# Patient Record
Sex: Female | Born: 1995 | Race: Black or African American | Hispanic: No | Marital: Single | Smoking: Current some day smoker
Health system: Southern US, Community
[De-identification: ages and names within clinical notes are randomized; demographics above are authoritative.]

## PROBLEM LIST (undated history)

## (undated) ENCOUNTER — Ambulatory Visit (HOSPITAL_COMMUNITY): Admission: EM | Payer: Self-pay | Source: Home / Self Care

## (undated) DIAGNOSIS — E1169 Type 2 diabetes mellitus with other specified complication: Secondary | ICD-10-CM

## (undated) DIAGNOSIS — M5430 Sciatica, unspecified side: Secondary | ICD-10-CM

## (undated) DIAGNOSIS — E669 Obesity, unspecified: Secondary | ICD-10-CM

## (undated) HISTORY — PX: NO PAST SURGERIES: SHX2092

---
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
## 2014-12-31 ENCOUNTER — Encounter (HOSPITAL_COMMUNITY): Payer: Self-pay | Admitting: Emergency Medicine

## 2014-12-31 ENCOUNTER — Emergency Department (HOSPITAL_COMMUNITY)
Admission: EM | Admit: 2014-12-31 | Discharge: 2014-12-31 | Disposition: A | Discharge status: Home / Self Care | Payer: Medicaid - Out of State | Attending: Emergency Medicine | Admitting: Emergency Medicine

## 2014-12-31 DIAGNOSIS — M5431 Sciatica, right side: Secondary | ICD-10-CM | POA: Insufficient documentation

## 2014-12-31 MED ORDER — CYCLOBENZAPRINE HCL 10 MG PO TABS
10.0000 mg | ORAL_TABLET | Freq: Once | ORAL | Status: AC
  Administered 2014-12-31: 10 mg via ORAL
  Filled 2014-12-31: qty 1

## 2014-12-31 MED ORDER — MELOXICAM 15 MG PO TABS: 15.0000 mg | ORAL_TABLET | Freq: Every day | ORAL | Status: DC

## 2014-12-31 MED ORDER — CYCLOBENZAPRINE HCL 10 MG PO TABS: 10.0000 mg | ORAL_TABLET | Freq: Two times a day (BID) | ORAL | Status: DC | PRN

## 2014-12-31 MED ORDER — KETOROLAC TROMETHAMINE 60 MG/2ML IM SOLN
60.0000 mg | Freq: Once | INTRAMUSCULAR | Status: AC
  Administered 2014-12-31: 60 mg via INTRAMUSCULAR
  Filled 2014-12-31: qty 2

## 2014-12-31 MED ORDER — PREDNISONE 20 MG PO TABS: 40.0000 mg | ORAL_TABLET | Freq: Every day | ORAL | Status: DC

## 2014-12-31 MED ORDER — PREDNISONE 20 MG PO TABS
40.0000 mg | ORAL_TABLET | Freq: Once | ORAL | Status: AC
  Administered 2014-12-31: 40 mg via ORAL
  Filled 2014-12-31: qty 2

## 2014-12-31 NOTE — ED Provider Notes (Signed)
CSN: [id number]     Arrival date & time 12/31/14  0146 History   First MD Initiated Contact with Patient 12/31/14 0224     Chief Complaint  Patient presents with  . Back Pain     (Consider location/radiation/quality/duration/timing/severity/associated sxs/prior Treatment) Patient is a 19 y.o. female presenting with back pain. The history is provided by the patient. No language interpreter was used.  Back Pain Location:  Lumbar spine Quality:  Shooting and aching Radiates to:  R posterior upper leg and R foot Pain severity:  Severe Pain is:  Same all the time Onset quality:  Gradual Duration:  2 weeks Timing:  Constant Progression:  Unchanged Chronicity:  New Context: not emotional stress, not falling, not jumping from heights, not lifting heavy objects, not MCA, not MVA, not occupational injury, not pedestrian accident, not physical stress, not recent illness, not recent injury and not twisting   Relieved by:  Nothing Worsened by:  Movement Ineffective treatments:  Ibuprofen Associated symptoms: no abdominal pain, no abdominal swelling, no bladder incontinence, no bowel incontinence, no fever, no headaches, no leg pain, no numbness, no pelvic pain, no tingling, no weakness and no weight loss   Risk factors: no hx of cancer, no hx of osteoporosis, no lack of exercise, no menopause, not obese, not pregnant, no recent surgery, no steroid use and no vascular disease     History reviewed. No pertinent past medical history. History reviewed. No pertinent past surgical history. History reviewed. No pertinent family history. Social History  Substance Use Topics  . Smoking status: Never Smoker   . Smokeless tobacco: None  . Alcohol Use: No   OB History    No data available     Review of Systems  Constitutional: Negative for fever and weight loss.  Gastrointestinal: Negative for abdominal pain and bowel incontinence.  Genitourinary: Negative for bladder incontinence and pelvic  pain.  Musculoskeletal: Positive for back pain.  Neurological: Negative for tingling, weakness, numbness and headaches.  All other systems reviewed and are negative.     Allergies  Review of patient's allergies indicates no known allergies.  Home Medications   Prior to Admission medications   Medication Sig Start Date End Date Taking? Authorizing Provider  ibuprofen (ADVIL,MOTRIN) 200 MG tablet Take 200 mg by mouth every 6 (six) hours as needed for moderate pain.    Yes Historical Provider, MD  benzonatate (TESSALON) 100 MG capsule Take 1 capsule (100 mg total) by mouth 3 (three) times daily as needed for cough. Patient not taking: Reported on 12/31/2014 04/10/13   Mathis Fare Presson, PA  ipratropium (ATROVENT) 0.06 % nasal spray Place 2 sprays into both nostrils 4 (four) times daily. Patient not taking: Reported on 12/31/2014 04/10/13   Jess Barters H Presson, PA   BP 120/78 mmHg  Pulse 75  Temp(Src) 98.4 F (36.9 C) (Oral)  Resp 18  SpO2 98%  LMP 12/14/2014 (Approximate) Physical Exam  Constitutional: She is oriented to person, place, and time. She appears well-developed and well-nourished. No distress.  HENT:  Head: Normocephalic and atraumatic.  Eyes: Conjunctivae and EOM are normal.  Neck: Normal range of motion.  Cardiovascular: Normal rate and regular rhythm.  Exam reveals no gallop and no friction rub.   No murmur heard. Pulmonary/Chest: Effort normal and breath sounds normal. She has no wheezes. She has no rales. She exhibits no tenderness.  Abdominal: Soft. She exhibits no distension. There is no tenderness. There is no rebound.  Musculoskeletal: Normal range of  motion.  Neurological: She is alert and oriented to person, place, and time. Coordination normal.  Speech is goal-oriented. Moves limbs without ataxia.   Skin: Skin is warm and dry.  Psychiatric: She has a normal mood and affect. Her behavior is normal.  Nursing note and vitals reviewed.   ED Course   Procedures (including critical care time) Labs Review Labs Reviewed - No data to display  Imaging Review No results found. I have personally reviewed and evaluated these images and lab results as part of my medical decision-making.   EKG Interpretation None      MDM   Final diagnoses:  Sciatica of right side    3:03 AM Patient clinically has sciatica on the right. Patient has no neuro deficits. Vitals stable and patient afebrile. Patient denies any injury. No xray needed at this time. Patient will have IM toradol, prednisone, and flexeril. Patient will be discharged with course of prednisone, mobic, and flexeril.     Emilia BeckKaitlyn Burleigh Brockmann, PA-C 12/31/14 [id number]  Loren Raceravid [place], MD 01/05/15 [phone]

## 2014-12-31 NOTE — ED Notes (Signed)
Pt states that she started having back pain approx. 2 weeks go that is now radiating down her R leg. Alert and oriented.

## 2014-12-31 NOTE — Discharge Instructions (Signed)
Take prednisone as directed until gone. Take mobic as needed for pain. Take flexeril as needed for muscle spasm. Refer to attached documents for more information.

## 2015-03-29 ENCOUNTER — Other Ambulatory Visit (INDEPENDENT_AMBULATORY_CARE_PROVIDER_SITE_OTHER): Payer: BLUE CROSS/BLUE SHIELD

## 2015-03-29 ENCOUNTER — Encounter: Payer: Self-pay | Admitting: Internal Medicine

## 2015-03-29 ENCOUNTER — Ambulatory Visit (INDEPENDENT_AMBULATORY_CARE_PROVIDER_SITE_OTHER): Payer: BLUE CROSS/BLUE SHIELD | Admitting: Internal Medicine

## 2015-03-29 VITALS — BP 104/72 | HR 70 | Temp 98.1°F | Resp 12 | Ht 65.0 in | Wt 247.0 lb

## 2015-03-29 DIAGNOSIS — Z0000 Encounter for general adult medical examination without abnormal findings: Secondary | ICD-10-CM | POA: Diagnosis not present

## 2015-03-29 LAB — LIPID PANEL
Cholesterol: 169 mg/dL (ref 0–200)
HDL: 53.5 mg/dL (ref >39.00)
LDL Cholesterol: 101 mg/dL — ABNORMAL HIGH (ref 0–99)
NONHDL: 115.96
Total CHOL/HDL Ratio: 3
Triglycerides: 77 mg/dL (ref 0.0–149.0)
VLDL: 15.4 mg/dL (ref 0.0–40.0)

## 2015-03-29 LAB — COMPREHENSIVE METABOLIC PANEL
ALT: 13 U/L (ref 0–35)
AST: 12 U/L (ref 0–37)
Albumin: 3.7 g/dL (ref 3.5–5.2)
Alkaline Phosphatase: 55 U/L (ref 39–117)
BUN: 10 mg/dL (ref 6–23)
CO2: 28 mEq/L (ref 19–32)
CREATININE: 0.76 mg/dL (ref 0.40–1.20)
Calcium: 9.1 mg/dL (ref 8.4–10.5)
Chloride: 106 mEq/L (ref 96–112)
GFR: 124.18 mL/min (ref >60.00)
Glucose, Bld: 147 mg/dL — ABNORMAL HIGH (ref 70–99)
Potassium: 4.3 mEq/L (ref 3.5–5.1)
SODIUM: 140 meq/L (ref 135–145)
TOTAL PROTEIN: 7.3 g/dL (ref 6.0–8.3)
Total Bilirubin: 0.2 mg/dL (ref 0.2–1.2)

## 2015-03-29 LAB — CBC
HCT: 36.3 % (ref 36.0–46.0)
HEMOGLOBIN: 11.8 g/dL — AB (ref 12.0–15.0)
MCHC: 32.5 g/dL (ref 30.0–36.0)
MCV: 83.9 fl (ref 78.0–100.0)
Platelets: 276 10*3/uL (ref 150.0–400.0)
RBC: 4.33 Mil/uL (ref 3.87–5.11)
RDW: 16.2 % — ABNORMAL HIGH (ref 11.5–14.6)
WBC: 6.8 10*3/uL (ref 4.5–10.5)

## 2015-03-29 LAB — HEMOGLOBIN A1C: Hgb A1c MFr Bld: 6.5 % (ref 4.6–6.5)

## 2015-03-29 NOTE — Patient Instructions (Signed)
We will check the labs today and check the urine for signs of infection. If there are no signs of infection we will call in diflucan for a yeast infection that is a pill you take once. If there are signs of infection we will call in an antibiotic.   Keep working on time for exercising.   Health Maintenance, Female Adopting a healthy lifestyle and getting preventive care can go a long way to promote health and wellness. Talk with your health care provider about what schedule of regular examinations is right for you. This is a good chance for you to check in with your provider about disease prevention and staying healthy. In between checkups, there are plenty of things you can do on your own. Experts have done a lot of research about which lifestyle changes and preventive measures are most likely to keep you healthy. Ask your health care provider for more information. WEIGHT AND DIET  Eat a healthy diet  Be sure to include plenty of vegetables, fruits, low-fat dairy products, and lean protein.  Do not eat a lot of foods high in solid fats, added sugars, or salt.  Get regular exercise. This is one of the most important things you can do for your health.  Most adults should exercise for at least 150 minutes each week. The exercise should increase your heart rate and make you sweat (moderate-intensity exercise).  Most adults should also do strengthening exercises at least twice a week. This is in addition to the moderate-intensity exercise.  Maintain a healthy weight  Body mass index (BMI) is a measurement that can be used to identify possible weight problems. It estimates body fat based on height and weight. Your health care provider can help determine your BMI and help you achieve or maintain a healthy weight.  For females 6 years of age and older:   A BMI below 18.5 is considered underweight.  A BMI of 18.5 to 24.9 is normal.  A BMI of 25 to 29.9 is considered overweight.  A BMI of 30  and above is considered obese.  Watch levels of cholesterol and blood lipids  You should start having your blood tested for lipids and cholesterol at 21 years of age, then have this test every 5 years.  You may need to have your cholesterol levels checked more often if:  Your lipid or cholesterol levels are high.  You are older than 21 years of age.  You are at high risk for heart disease.  CANCER SCREENING   Lung Cancer  Lung cancer screening is recommended for adults 70-18 years old who are at high risk for lung cancer because of a history of smoking.  A yearly low-dose CT scan of the lungs is recommended for people who:  Currently smoke.  Have quit within the past 15 years.  Have at least a 30-pack-year history of smoking. A pack year is smoking an average of one pack of cigarettes a day for 1 year.  Yearly screening should continue until it has been 15 years since you quit.  Yearly screening should stop if you develop a health problem that would prevent you from having lung cancer treatment.  Breast Cancer  Practice breast self-awareness. This means understanding how your breasts normally appear and feel.  It also means doing regular breast self-exams. Let your health care provider know about any changes, no matter how small.  If you are in your 20s or 30s, you should have a clinical breast exam (  CBE) by a health care provider every 1-3 years as part of a regular health exam.  If you are 61 or older, have a CBE every year. Also consider having a breast X-ray (mammogram) every year.  If you have a family history of breast cancer, talk to your health care provider about genetic screening.  If you are at high risk for breast cancer, talk to your health care provider about having an MRI and a mammogram every year.  Breast cancer gene (BRCA) assessment is recommended for women who have family members with BRCA-related cancers. BRCA-related cancers  include:  Breast.  Ovarian.  Tubal.  Peritoneal cancers.  Results of the assessment will determine the need for genetic counseling and BRCA1 and BRCA2 testing. Cervical Cancer Your health care provider may recommend that you be screened regularly for cancer of the pelvic organs (ovaries, uterus, and vagina). This screening involves a pelvic examination, including checking for microscopic changes to the surface of your cervix (Pap test). You may be encouraged to have this screening done every 3 years, beginning at age 86.  For women ages 65-65, health care providers may recommend pelvic exams and Pap testing every 3 years, or they may recommend the Pap and pelvic exam, combined with testing for human papilloma virus (HPV), every 5 years. Some types of HPV increase your risk of cervical cancer. Testing for HPV may also be done on women of any age with unclear Pap test results.  Other health care providers may not recommend any screening for nonpregnant women who are considered low risk for pelvic cancer and who do not have symptoms. Ask your health care provider if a screening pelvic exam is right for you.  If you have had past treatment for cervical cancer or a condition that could lead to cancer, you need Pap tests and screening for cancer for at least 20 years after your treatment. If Pap tests have been discontinued, your risk factors (such as having a new sexual partner) need to be reassessed to determine if screening should resume. Some women have medical problems that increase the chance of getting cervical cancer. In these cases, your health care provider may recommend more frequent screening and Pap tests. Colorectal Cancer  This type of cancer can be detected and often prevented.  Routine colorectal cancer screening usually begins at 21 years of age and continues through 21 years of age.  Your health care provider may recommend screening at an earlier age if you have risk factors for  colon cancer.  Your health care provider may also recommend using home test kits to check for hidden blood in the stool.  A small camera at the end of a tube can be used to examine your colon directly (sigmoidoscopy or colonoscopy). This is done to check for the earliest forms of colorectal cancer.  Routine screening usually begins at age 29.  Direct examination of the colon should be repeated every 5-10 years through 21 years of age. However, you may need to be screened more often if early forms of precancerous polyps or small growths are found. Skin Cancer  Check your skin from head to toe regularly.  Tell your health care provider about any new moles or changes in moles, especially if there is a change in a mole's shape or color.  Also tell your health care provider if you have a mole that is larger than the size of a pencil eraser.  Always use sunscreen. Apply sunscreen liberally and repeatedly throughout  the day.  Protect yourself by wearing long sleeves, pants, a wide-brimmed hat, and sunglasses whenever you are outside. HEART DISEASE, DIABETES, AND HIGH BLOOD PRESSURE   High blood pressure causes heart disease and increases the risk of stroke. High blood pressure is more likely to develop in:  People who have blood pressure in the high end of the normal range (130-139/85-89 mm Hg).  People who are overweight or obese.  People who are African American.  If you are 18-39 years of age, have your blood pressure checked every 3-5 years. If you are 40 years of age or older, have your blood pressure checked every year. You should have your blood pressure measured twice--once when you are at a hospital or clinic, and once when you are not at a hospital or clinic. Record the average of the two measurements. To check your blood pressure when you are not at a hospital or clinic, you can use:  An automated blood pressure machine at a pharmacy.  A home blood pressure monitor.  If you  are between 55 years and 79 years old, ask your health care provider if you should take aspirin to prevent strokes.  Have regular diabetes screenings. This involves taking a blood sample to check your fasting blood sugar level.  If you are at a normal weight and have a low risk for diabetes, have this test once every three years after 21 years of age.  If you are overweight and have a high risk for diabetes, consider being tested at a younger age or more often. PREVENTING INFECTION  Hepatitis B  If you have a higher risk for hepatitis B, you should be screened for this virus. You are considered at high risk for hepatitis B if:  You were born in a country where hepatitis B is common. Ask your health care provider which countries are considered high risk.  Your parents were born in a high-risk country, and you have not been immunized against hepatitis B (hepatitis B vaccine).  You have HIV or AIDS.  You use needles to inject street drugs.  You live with someone who has hepatitis B.  You have had sex with someone who has hepatitis B.  You get hemodialysis treatment.  You take certain medicines for conditions, including cancer, organ transplantation, and autoimmune conditions. Hepatitis C  Blood testing is recommended for:  Everyone born from 1945 through 1965.  Anyone with known risk factors for hepatitis C. Sexually transmitted infections (STIs)  You should be screened for sexually transmitted infections (STIs) including gonorrhea and chlamydia if:  You are sexually active and are younger than 21 years of age.  You are older than 21 years of age and your health care provider tells you that you are at risk for this type of infection.  Your sexual activity has changed since you were last screened and you are at an increased risk for chlamydia or gonorrhea. Ask your health care provider if you are at risk.  If you do not have HIV, but are at risk, it may be recommended that you  take a prescription medicine daily to prevent HIV infection. This is called pre-exposure prophylaxis (PrEP). You are considered at risk if:  You are sexually active and do not regularly use condoms or know the HIV status of your partner(s).  You take drugs by injection.  You are sexually active with a partner who has HIV. Talk with your health care provider about whether you are at high risk   of being infected with HIV. If you choose to begin PrEP, you should first be tested for HIV. You should then be tested every 3 months for as long as you are taking PrEP.  PREGNANCY   If you are premenopausal and you may become pregnant, ask your health care provider about preconception counseling.  If you may become pregnant, take 400 to 800 micrograms (mcg) of folic acid every day.  If you want to prevent pregnancy, talk to your health care provider about birth control (contraception). OSTEOPOROSIS AND MENOPAUSE   Osteoporosis is a disease in which the bones lose minerals and strength with aging. This can result in serious bone fractures. Your risk for osteoporosis can be identified using a bone density scan.  If you are 65 years of age or older, or if you are at risk for osteoporosis and fractures, ask your health care provider if you should be screened.  Ask your health care provider whether you should take a calcium or vitamin D supplement to lower your risk for osteoporosis.  Menopause may have certain physical symptoms and risks.  Hormone replacement therapy may reduce some of these symptoms and risks. Talk to your health care provider about whether hormone replacement therapy is right for you.  HOME CARE INSTRUCTIONS   Schedule regular health, dental, and eye exams.  Stay current with your immunizations.   Do not use any tobacco products including cigarettes, chewing tobacco, or electronic cigarettes.  If you are pregnant, do not drink alcohol.  If you are breastfeeding, limit how  much and how often you drink alcohol.  Limit alcohol intake to no more than 1 drink per day for nonpregnant women. One drink equals 12 ounces of beer, 5 ounces of wine, or 1 ounces of hard liquor.  Do not use street drugs.  Do not share needles.  Ask your health care provider for help if you need support or information about quitting drugs.  Tell your health care provider if you often feel depressed.  Tell your health care provider if you have ever been abused or do not feel safe at home.   This information is not intended to replace advice given to you by your health care provider. Make sure you discuss any questions you have with your health care provider.   Document Released: 08/15/2010 Document Revised: 02/20/2014 Document Reviewed: 01/01/2013 Elsevier Interactive Patient Education 2016 [employer]  

## 2015-03-29 NOTE — Progress Notes (Signed)
Pre visit review using our clinic review tool, if applicable. No additional management support is needed unless otherwise documented below in the visit note. 

## 2015-03-30 ENCOUNTER — Encounter: Payer: Self-pay | Admitting: Internal Medicine

## 2015-03-30 DIAGNOSIS — Z0000 Encounter for general adult medical examination without abnormal findings: Secondary | ICD-10-CM | POA: Insufficient documentation

## 2015-03-30 NOTE — Assessment & Plan Note (Signed)
Checking labs today, talked to her about the need for regular exercise to help with her health. She is a non-smoker and no problems with depression or anxiety.

## 2015-03-30 NOTE — Progress Notes (Signed)
   Subjective:    Patient ID: Jade Mullins, female    DOB: 05-11-1995, 19 y.o.   MRN: [id number]  HPI The patient is a new 20 YO female coming in for wellness. No concerns or complaints. No PMH, no meds. Normal length periods with 1 day cramping. No depression or anxiety, non-smoker. Not exercising but working 2 jobs.   PMH, [facility], social history reviewed and updated.   Review of Systems  Constitutional: Negative for fever, activity change, appetite change, fatigue and unexpected weight change.  HENT: Negative.   Eyes: Negative.   Respiratory: Negative for cough, chest tightness, shortness of breath and wheezing.   Cardiovascular: Negative for chest pain, palpitations and leg swelling.  Gastrointestinal: Negative for nausea, abdominal pain, diarrhea, constipation and abdominal distention.  Musculoskeletal: Negative.   Skin: Negative.   Neurological: Negative.   Psychiatric/Behavioral: Negative.       Objective:   Physical Exam  Constitutional: She is oriented to person, place, and time. She appears well-developed and well-nourished.  Overweight  HENT:  Head: Normocephalic and atraumatic.  Eyes: EOM are normal.  Neck: Normal range of motion.  Cardiovascular: Normal rate and regular rhythm.   Pulmonary/Chest: Effort normal and breath sounds normal. No respiratory distress. She has no wheezes. She has no rales.  Abdominal: Soft. Bowel sounds are normal. She exhibits no distension. There is no tenderness. There is no rebound.  Musculoskeletal: She exhibits no edema.  Neurological: She is alert and oriented to person, place, and time. Coordination normal.  Skin: Skin is warm and dry.  Psychiatric: She has a normal mood and affect.   Filed Vitals:   03/29/15 1513  BP: 104/72  Pulse: 70  Temp: 98.1 F (36.7 C)  TempSrc: Oral  Resp: 12  Height:  (1.651 m)  Weight: 247 lb (112.038 kg)  SpO2: 98%      Assessment & Plan:

## 2015-03-30 NOTE — Assessment & Plan Note (Signed)
Talked to her about the need for exercise and healthy eating with the goal of health and not just weight loss. We did talk about the fact that extra weight does cause joints to wear more quickly and can cause long term problems.

## 2015-06-22 ENCOUNTER — Ambulatory Visit (HOSPITAL_COMMUNITY)
Admission: EM | Admit: 2015-06-22 | Discharge: 2015-06-22 | Disposition: A | Discharge status: Home / Self Care | Payer: BLUE CROSS/BLUE SHIELD | Attending: Family Medicine | Admitting: Family Medicine

## 2015-06-22 ENCOUNTER — Encounter (HOSPITAL_COMMUNITY): Payer: Self-pay | Admitting: Emergency Medicine

## 2015-06-22 DIAGNOSIS — J029 Acute pharyngitis, unspecified: Secondary | ICD-10-CM | POA: Insufficient documentation

## 2015-06-22 DIAGNOSIS — R358 Other polyuria: Secondary | ICD-10-CM | POA: Insufficient documentation

## 2015-06-22 DIAGNOSIS — M545 Low back pain, unspecified: Secondary | ICD-10-CM

## 2015-06-22 DIAGNOSIS — R6883 Chills (without fever): Secondary | ICD-10-CM | POA: Diagnosis not present

## 2015-06-22 DIAGNOSIS — Z87891 Personal history of nicotine dependence: Secondary | ICD-10-CM | POA: Diagnosis not present

## 2015-06-22 DIAGNOSIS — J069 Acute upper respiratory infection, unspecified: Secondary | ICD-10-CM

## 2015-06-22 DIAGNOSIS — R3589 Other polyuria: Secondary | ICD-10-CM

## 2015-06-22 LAB — POCT URINALYSIS DIP (DEVICE)
Bilirubin Urine: NEGATIVE
Glucose, UA: 500 mg/dL — AB
KETONES UR: NEGATIVE mg/dL
Leukocytes, UA: NEGATIVE
Nitrite: NEGATIVE
PH: 7 (ref 5.0–8.0)
PROTEIN: 100 mg/dL — AB
Specific Gravity, Urine: 1.02 (ref 1.005–1.030)
Urobilinogen, UA: 4 mg/dL — ABNORMAL HIGH (ref 0.0–1.0)

## 2015-06-22 LAB — POCT RAPID STREP A: Streptococcus, Group A Screen (Direct): NEGATIVE

## 2015-06-22 LAB — POCT PREGNANCY, URINE: Preg Test, Ur: NEGATIVE

## 2015-06-22 MED ORDER — GUAIFENESIN ER 600 MG PO TB12: 1200.0000 mg | ORAL_TABLET | Freq: Two times a day (BID) | ORAL | Status: DC | PRN

## 2015-06-22 NOTE — ED Notes (Addendum)
Sore throat and back pain.  Onset of symptoms last week.  Patient reports low back pain, chest soreness, and blood tinged sputum for 2 days.

## 2015-06-22 NOTE — ED Provider Notes (Signed)
CSN: [id number]     Arrival date & time 06/22/15  1710 History   First MD Initiated Contact with Patient 06/22/15 1802     Chief Complaint  Patient presents with  . Sore Throat  . Back Pain   (Consider location/radiation/quality/duration/timing/severity/associated sxs/prior Treatment) Patient is a 20 y.o. female presenting with pharyngitis and back pain. The history is provided by the patient. No language interpreter was used.  Sore Throat Pertinent negatives include no chest pain and no abdominal pain.  Back Pain Associated symptoms: no abdominal pain and no chest pain   Patient presents with complaint of 2 days of cough and back pain, throat swelling.  Has had headache, then back ache, with chills. No objective fevers. Developed cough with some blood in sputum. She has had some polyuria but no dysuria.  She started her LMP 2 days ago.  Roommate and best friend with similar sxs 2 days prior, just resolved spontaneously.   Social Hx; smokes 2 cigarettes every 2 days. Sick roommate.     History reviewed. No pertinent past medical history. History reviewed. No pertinent past surgical history. Family History  Problem Relation Age of Onset  . Arthritis Mother   . Diabetes Mother   . Diabetes Brother   . Hypertension Maternal Aunt   . Diabetes Maternal Aunt   . Cancer Maternal Aunt     prostate  . Hypertension Maternal Uncle   . Diabetes Maternal Uncle   . Arthritis Maternal Grandmother   . Heart disease Maternal Grandmother   . Hypertension Maternal Grandmother   . Diabetes Maternal Grandmother    Social History  Substance Use Topics  . Smoking status: Former Games developer  . Smokeless tobacco: None  . Alcohol Use: No   OB History    No data available     Review of Systems  Constitutional: Positive for chills and fatigue.  HENT: Positive for sore throat and trouble swallowing.   Respiratory: Positive for cough and wheezing.   Cardiovascular: Negative for chest pain.   Gastrointestinal: Positive for diarrhea. Negative for nausea, vomiting and abdominal pain.  Musculoskeletal: Positive for back pain.    Allergies  Review of patient's allergies indicates no known allergies.  Home Medications   Prior to Admission medications   Not on File   Meds Ordered and Administered this Visit  Medications - No data to display  BP 129/94 mmHg  Pulse 87  Temp(Src) 98.1 F (36.7 C) (Oral)  Resp 16  SpO2 98%  LMP 06/22/2015 No data found.   Physical Exam  Constitutional: She appears well-developed and well-nourished. No distress.  HENT:  Head: Normocephalic and atraumatic.  Right Ear: External ear normal.  Left Ear: External ear normal.  Oropharynx with exudate and erythema.   Eyes: Conjunctivae and EOM are normal. Right eye exhibits no discharge. Left eye exhibits no discharge. No scleral icterus.  Neck: Normal range of motion. Neck supple.  Shotty anterior cervical adenopathy  Cardiovascular: Normal rate, regular rhythm and normal heart sounds.   No murmur heard. Pulmonary/Chest: Effort normal and breath sounds normal. No respiratory distress. She has no wheezes. She has no rales. She exhibits no tenderness.  Abdominal: Soft. There is no tenderness.  Skin: She is not diaphoretic.    ED Course  Procedures (including critical care time)  Labs Review Labs Reviewed  POCT URINALYSIS DIP (DEVICE) - Abnormal; Notable for the following:    Glucose, UA 500 (*)    Hgb urine dipstick LARGE (*)  Protein, ur 100 (*)    Urobilinogen, UA 4.0 (*)    All other components within normal limits  POCT PREGNANCY, URINE    Imaging Review No results found.   Visual Acuity Review  Right Eye Distance:   Left Eye Distance:   Bilateral Distance:    Right Eye Near:   Left Eye Near:    Bilateral Near:         MDM   1. Pharyngitis   2. Chills   3. Polyuria   4. Acute low back pain    Patient with cough, pharyngitis, rapid strep negative. UA  negative for infection (patient on menses now). UPreg negative. Suspect viral URI; given roommate's recent similar episode. Supportive treatment, [facility].     Barbaraann BarthelJames O Tiauna Whisnant, MD 06/22/15 [phone]

## 2015-06-22 NOTE — Discharge Instructions (Signed)
It is a pleasure to see you today in the urgent care center.  The rapid strep test and the urine test are both negative for infection.   I believe your symptoms are caused by a viral respiratory infection.  I recommend Tylenol and Ibuprofen as needed for the pain/sore throat, Mucinex 600mg  tablets (1-2 tablets by mouth every 12 hours) to loosen mucus and help with the cough.   Follow up with your primary care doctor if not improving in the coming 2 days.

## 2015-06-23 LAB — CULTURE, GROUP A STREP (THRC)

## 2015-06-24 ENCOUNTER — Telehealth: Payer: Self-pay | Admitting: Internal Medicine

## 2015-06-24 MED ORDER — PENICILLIN V POTASSIUM 500 MG PO TABS: 500.0000 mg | ORAL_TABLET | Freq: Two times a day (BID) | ORAL | Status: AC

## 2015-06-24 NOTE — ED Notes (Signed)
Please let patient know that throat culture was positive for strep.  Rx for penicillin V sent to pharmacy of record, [facility] [address].  Recheck for further evaluation if symptoms do not improve.  LM  Eustace MooreLaura W Ben Sanz, MD 06/24/15 [phone]

## 2015-06-25 ENCOUNTER — Telehealth (HOSPITAL_COMMUNITY): Payer: Self-pay | Admitting: Emergency Medicine
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
## 2015-06-25 NOTE — ED Notes (Signed)
Called pt and notified of recent lab results from visit 5/9 Pt ID'd properly... Reports feeling better and sx have subsided in re: to back but still having ST Adv pt to p/u Rx and to finish antibiotics.   Per Dr. Dayton ScrapeMurray,  Clinical staff, please let patient know that throat culture was positive for strep.  Rx for penicillin V sent to pharmacy of record, [facility] [address].  Recheck for further evaluation if symptoms do not improve. LM   Adv pt if sx are not getting better to return  Pt verb understanding

## 2015-08-10 ENCOUNTER — Emergency Department (HOSPITAL_COMMUNITY)
Admission: EM | Admit: 2015-08-10 | Discharge: 2015-08-10 | Disposition: A | Discharge status: Home / Self Care | Payer: BLUE CROSS/BLUE SHIELD | Attending: Emergency Medicine | Admitting: Emergency Medicine

## 2015-08-10 ENCOUNTER — Encounter (HOSPITAL_COMMUNITY): Payer: Self-pay | Admitting: Emergency Medicine

## 2015-08-10 DIAGNOSIS — L923 Foreign body granuloma of the skin and subcutaneous tissue: Secondary | ICD-10-CM

## 2015-08-10 DIAGNOSIS — L818 Other specified disorders of pigmentation: Secondary | ICD-10-CM | POA: Diagnosis present

## 2015-08-10 DIAGNOSIS — F17200 Nicotine dependence, unspecified, uncomplicated: Secondary | ICD-10-CM | POA: Diagnosis not present

## 2015-08-10 DIAGNOSIS — L03113 Cellulitis of right upper limb: Secondary | ICD-10-CM | POA: Diagnosis not present

## 2015-08-10 MED ORDER — CEPHALEXIN 500 MG PO CAPS: 500.0000 mg | ORAL_CAPSULE | Freq: Four times a day (QID) | ORAL | Status: DC

## 2015-08-10 MED ORDER — TRIAMCINOLONE ACETONIDE 0.1 % EX CREA: 1.0000 "application " | TOPICAL_CREAM | Freq: Two times a day (BID) | CUTANEOUS | Status: DC
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
## 2015-08-10 NOTE — Discharge Instructions (Signed)
Take keflex as prescribed until all gone. Apply kenalog cream topically twice a day. Follow up as needed. Return if worsening or if develop fever, vomiting, drainage, or any new concerning symptom.    Cellulitis Cellulitis is an infection of the skin and the tissue beneath it. The infected area is usually red and tender. Cellulitis occurs most often in the arms and lower legs.  CAUSES  Cellulitis is caused by bacteria that enter the skin through cracks or cuts in the skin. The most common types of bacteria that cause cellulitis are staphylococci and streptococci. SIGNS AND SYMPTOMS   Redness and warmth.  Swelling.  Tenderness or pain.  Fever. DIAGNOSIS  Your health care provider can usually determine what is wrong based on a physical exam. Blood tests may also be done. TREATMENT  Treatment usually involves taking an antibiotic medicine. HOME CARE INSTRUCTIONS   Take your antibiotic medicine as directed by your health care provider. Finish the antibiotic even if you start to feel better.  Keep the infected arm or leg elevated to reduce swelling.  Apply a warm cloth to the affected area up to 4 times per day to relieve pain.  Take medicines only as directed by your health care provider.  Keep all follow-up visits as directed by your health care provider. SEEK MEDICAL CARE IF:   You notice red streaks coming from the infected area.  Your red area gets larger or turns dark in color.  Your bone or joint underneath the infected area becomes painful after the skin has healed.  Your infection returns in the same area or another area.  You notice a swollen bump in the infected area.  You develop new symptoms.  You have a fever. SEEK IMMEDIATE MEDICAL CARE IF:   You feel very sleepy.  You develop vomiting or diarrhea.  You have a general ill feeling (malaise) with muscle aches and pains.   This information is not intended to replace advice given to you by your health care  provider. Make sure you discuss any questions you have with your health care provider.   Document Released: 11/09/2004 Document Revised: 10/21/2014 Document Reviewed: 04/17/2011 Elsevier Interactive Patient Education [employer] Elsevier Inc.
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
## 2015-09-21 ENCOUNTER — Encounter (HOSPITAL_COMMUNITY): Payer: Self-pay | Admitting: Emergency Medicine

## 2015-09-21 ENCOUNTER — Emergency Department (HOSPITAL_COMMUNITY)
Admission: EM | Admit: 2015-09-21 | Discharge: 2015-09-22 | Disposition: A | Discharge status: Home / Self Care | Payer: BLUE CROSS/BLUE SHIELD | Attending: Emergency Medicine | Admitting: Emergency Medicine

## 2015-09-21 DIAGNOSIS — M545 Low back pain: Secondary | ICD-10-CM | POA: Diagnosis present

## 2015-09-21 DIAGNOSIS — F17210 Nicotine dependence, cigarettes, uncomplicated: Secondary | ICD-10-CM | POA: Insufficient documentation

## 2015-09-21 DIAGNOSIS — M5441 Lumbago with sciatica, right side: Secondary | ICD-10-CM | POA: Insufficient documentation

## 2015-09-21 DIAGNOSIS — M5431 Sciatica, right side: Secondary | ICD-10-CM

## 2015-09-21 MED ORDER — METHOCARBAMOL 750 MG PO TABS: 750.0000 mg | ORAL_TABLET | Freq: Four times a day (QID) | ORAL | 0 refills | Status: DC

## 2015-09-21 MED ORDER — KETOROLAC TROMETHAMINE 60 MG/2ML IM SOLN
60.0000 mg | Freq: Once | INTRAMUSCULAR | Status: DC
  Filled 2015-09-21: qty 2

## 2015-09-21 MED ORDER — PREDNISONE 10 MG (21) PO TBPK: 10.0000 mg | ORAL_TABLET | Freq: Every day | ORAL | 0 refills | Status: DC

## 2015-09-21 MED ORDER — HYDROCODONE-ACETAMINOPHEN 5-325 MG PO TABS: 2.0000 | ORAL_TABLET | ORAL | 0 refills | Status: DC | PRN

## 2015-09-21 NOTE — ED Triage Notes (Signed)
Patient reports right lower back pain which radiates to right leg x4 days.  Reports previous episode 3 months ago.  States she was seen and treated for this.  Patient denies injury.

## 2015-09-21 NOTE — ED Provider Notes (Signed)
[facility]-EMERGENCY DEPT Provider Note   CSN: [id number] Arrival date & time: 09/21/15  2245  First Provider Contact:  None       History   Chief Complaint Chief Complaint  Patient presents with  . Back Pain    HPI Jade Mullins is a 20 y.o. female.  20 year old female presents with right sciatic pain times several days. History of similar symptoms in the past. Does complain some numbness which was down her right lower extremity. Denies any back pain. No bowel bladder function. Pain characterized as sharp and worse with movement better with rest. Treatment use prior to arrival. Denies any recent injuries      History reviewed. No pertinent past medical history.  Patient Active Problem List   Diagnosis Date Noted  . Morbid obesity (HCC) 03/30/2015  . Routine general medical examination at a health care facility 03/30/2015    History reviewed. No pertinent surgical history.  OB History    No data available       Home Medications    Prior to Admission medications   Medication Sig Start Date End Date Taking? Authorizing Provider  cephALEXin (KEFLEX) 500 MG capsule Take 1 capsule (500 mg total) by mouth 4 (four) times daily. Patient not taking: Reported on 09/21/2015 08/10/15   Jaynie Crumbleatyana Kirichenko, PA-C  guaiFENesin (MUCINEX) 600 MG 12 hr tablet Take 2 tablets (1,200 mg total) by mouth 2 (two) times daily as needed. Patient not taking: Reported on 09/21/2015 06/22/15   Barbaraann BarthelJames O Breen, MD  triamcinolone cream (KENALOG) 0.1 % Apply 1 application topically 2 (two) times daily. Patient not taking: Reported on 09/21/2015 08/10/15   Jaynie Crumbleatyana Kirichenko, PA-C    Family History Family History  Problem Relation Age of Onset  . Arthritis Mother   . Diabetes Mother   . Diabetes Brother   . Hypertension Maternal Aunt   . Diabetes Maternal Aunt   . Cancer Maternal Aunt     prostate  . Hypertension Maternal Uncle   . Diabetes Maternal Uncle   . Arthritis Maternal Grandmother   . Heart  disease Maternal Grandmother   . Hypertension Maternal Grandmother   . Diabetes Maternal Grandmother     Social History Social History  Substance Use Topics  . Smoking status: Current Every Day Smoker    Packs/day: 0.50    Types: Cigarettes  . Smokeless tobacco: Never Used  . Alcohol use No     Allergies   Review of patient's allergies indicates no known allergies.   Review of Systems Review of Systems  All other systems reviewed and are negative.    Physical Exam Updated Vital Signs BP 140/90 (BP Location: Left Arm)   Pulse 87   Temp 97.7 F (36.5 C) (Oral)   Ht 5\' 5"  (1.651 m)   Wt 113.4 kg   LMP 09/14/2015   SpO2 99%   BMI 41.60 kg/m   Physical Exam  Constitutional: She is oriented to person, place, and time. She appears well-developed and well-nourished.  Non-toxic appearance. No distress.  HENT:  Head: Normocephalic and atraumatic.  Eyes: Conjunctivae, EOM and lids are normal. Pupils are equal, round, and reactive to light.  Neck: Normal range of motion. Neck supple. No tracheal deviation present. No thyroid mass present.  Cardiovascular: Normal rate, regular rhythm and normal heart sounds.  Exam reveals no gallop.   No murmur heard. Pulmonary/Chest: Effort normal and breath sounds normal. No stridor. No respiratory distress. She has no decreased breath sounds. She has no wheezes.  She has no rhonchi. She has no rales.  Abdominal: Soft. Normal appearance and bowel sounds are normal. She exhibits no distension. There is no tenderness. There is no rebound and no CVA tenderness.  Musculoskeletal: Normal range of motion. She exhibits no edema or tenderness.       Legs: Neurological: She is alert and oriented to person, place, and time. She has normal strength. No cranial nerve deficit or sensory deficit. GCS eye subscore is 4. GCS verbal subscore is 5. GCS motor subscore is 6.  Skin: Skin is warm and dry. No abrasion and no rash noted.  Psychiatric: She has a  normal mood and affect. Her speech is normal and behavior is normal.  Nursing note and vitals reviewed.    ED Treatments / Results  Labs (all labs ordered are listed, but only abnormal results are displayed) Labs Reviewed - No data to display  EKG  EKG Interpretation None       Radiology No results found.  Procedures Procedures (including critical care time)  Medications Ordered in ED Medications  ketorolac (TORADOL) injection 60 mg (not administered)     Initial Impression / Assessment and Plan / ED Course  I have reviewed the triage vital signs and the nursing notes.  Pertinent labs & imaging results that were available during my care of the patient were reviewed by me and considered in my medical decision making (see chart for details).  Clinical Course    Patient with sciatica and given Toradol here for pain and prescriptions for pain relief  Final Clinical Impressions(s) / ED Diagnoses   Final diagnoses:  None    New Prescriptions New Prescriptions   No medications on file     Lorre Nick, MD 09/21/15 2316

## 2016-01-10 ENCOUNTER — Emergency Department (HOSPITAL_COMMUNITY)
Admission: EM | Admit: 2016-01-10 | Discharge: 2016-01-10 | Discharge status: Against Medical Advice | Payer: BLUE CROSS/BLUE SHIELD

## 2016-01-10 NOTE — ED Notes (Signed)
Called for pt 3 times and there was no response.

## 2016-01-10 NOTE — ED Notes (Signed)
Pt called 1 last time w/o answer.

## 2016-02-05 ENCOUNTER — Emergency Department (HOSPITAL_COMMUNITY)
Admission: EM | Admit: 2016-02-05 | Discharge: 2016-02-05 | Disposition: A | Discharge status: Home / Self Care | Payer: BLUE CROSS/BLUE SHIELD | Attending: Dermatology | Admitting: Dermatology

## 2016-02-05 ENCOUNTER — Encounter (HOSPITAL_COMMUNITY): Payer: Self-pay | Admitting: Emergency Medicine

## 2016-02-05 DIAGNOSIS — Z041 Encounter for examination and observation following transport accident: Secondary | ICD-10-CM | POA: Insufficient documentation

## 2016-02-05 DIAGNOSIS — Z5321 Procedure and treatment not carried out due to patient leaving prior to being seen by health care provider: Secondary | ICD-10-CM | POA: Insufficient documentation

## 2016-02-05 DIAGNOSIS — Y92410 Unspecified street and highway as the place of occurrence of the external cause: Secondary | ICD-10-CM | POA: Diagnosis not present

## 2016-02-05 DIAGNOSIS — Y999 Unspecified external cause status: Secondary | ICD-10-CM | POA: Insufficient documentation

## 2016-02-05 DIAGNOSIS — Z79899 Other long term (current) drug therapy: Secondary | ICD-10-CM | POA: Insufficient documentation

## 2016-02-05 DIAGNOSIS — F17210 Nicotine dependence, cigarettes, uncomplicated: Secondary | ICD-10-CM | POA: Insufficient documentation

## 2016-02-05 DIAGNOSIS — Y939 Activity, unspecified: Secondary | ICD-10-CM | POA: Insufficient documentation

## 2016-02-05 NOTE — ED Notes (Signed)
Pt came to nurse first to advise she is leaving.  Encouraged pt to stay, pt declined.  Advised to return to ED if symptoms persist or worsen.  Pt verbalized understanding.

## 2016-02-05 NOTE — ED Triage Notes (Signed)
C-collar applied in triage.
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
## 2016-02-09 ENCOUNTER — Encounter (HOSPITAL_COMMUNITY): Payer: Self-pay | Admitting: Family Medicine

## 2016-02-09 ENCOUNTER — Emergency Department (HOSPITAL_COMMUNITY)
Admission: EM | Admit: 2016-02-09 | Discharge: 2016-02-09 | Disposition: A | Discharge status: Home / Self Care | Payer: BLUE CROSS/BLUE SHIELD | Attending: Emergency Medicine | Admitting: Emergency Medicine

## 2016-02-09 DIAGNOSIS — Y92410 Unspecified street and highway as the place of occurrence of the external cause: Secondary | ICD-10-CM | POA: Insufficient documentation

## 2016-02-09 DIAGNOSIS — F17210 Nicotine dependence, cigarettes, uncomplicated: Secondary | ICD-10-CM | POA: Diagnosis not present

## 2016-02-09 DIAGNOSIS — Y999 Unspecified external cause status: Secondary | ICD-10-CM | POA: Diagnosis not present

## 2016-02-09 DIAGNOSIS — M545 Low back pain: Secondary | ICD-10-CM | POA: Insufficient documentation

## 2016-02-09 DIAGNOSIS — Y939 Activity, unspecified: Secondary | ICD-10-CM | POA: Insufficient documentation

## 2016-02-09 HISTORY — DX: Sciatica, unspecified side: M54.30

## 2016-02-09 MED ORDER — LIDOCAINE 5 % EX PTCH: 1.0000 | MEDICATED_PATCH | CUTANEOUS | 0 refills | Status: DC

## 2016-02-09 MED ORDER — NAPROXEN 500 MG PO TABS: 500.0000 mg | ORAL_TABLET | Freq: Two times a day (BID) | ORAL | 0 refills | Status: DC
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
## 2016-02-09 NOTE — Discharge Instructions (Signed)
Take it easy, but do not lay around too much as this may make any stiffness worse. Take 500 mg of naproxen every 12 hours or 800 mg of ibuprofen every 8 hours for the next 3 days. Take these medications with food to avoid upset stomach. Robaxin is a muscle relaxer and may help loosen stiff muscles. Do not take the Robaxin while driving or performing other dangerous activities. Be sure to perform the attached exercises starting with three times a week and working up to performing them daily. This is an essential part of preventing long term problems. Follow up with a primary care provider for any future management of these complaints. °
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
## 2016-03-28 ENCOUNTER — Encounter: Payer: BLUE CROSS/BLUE SHIELD | Admitting: Internal Medicine

## 2016-04-03 ENCOUNTER — Other Ambulatory Visit (INDEPENDENT_AMBULATORY_CARE_PROVIDER_SITE_OTHER): Payer: BLUE CROSS/BLUE SHIELD

## 2016-04-03 ENCOUNTER — Encounter: Payer: Self-pay | Admitting: Internal Medicine

## 2016-04-03 ENCOUNTER — Ambulatory Visit (INDEPENDENT_AMBULATORY_CARE_PROVIDER_SITE_OTHER): Payer: BLUE CROSS/BLUE SHIELD | Admitting: Internal Medicine

## 2016-04-03 VITALS — BP 128/78 | HR 75 | Temp 98.4°F | Ht 64.0 in | Wt 277.0 lb

## 2016-04-03 DIAGNOSIS — R7303 Prediabetes: Secondary | ICD-10-CM | POA: Insufficient documentation

## 2016-04-03 DIAGNOSIS — Z0000 Encounter for general adult medical examination without abnormal findings: Secondary | ICD-10-CM

## 2016-04-03 LAB — T4, FREE: Free T4: 1.11 ng/dL (ref 0.60–1.60)

## 2016-04-03 LAB — LIPID PANEL
CHOL/HDL RATIO: 3
Cholesterol: 148 mg/dL (ref 0–200)
HDL: 58.4 mg/dL (ref >39.00)
LDL CALC: 81 mg/dL (ref 0–99)
NonHDL: 90.01
Triglycerides: 45 mg/dL (ref 0.0–149.0)
VLDL: 9 mg/dL (ref 0.0–40.0)

## 2016-04-03 LAB — CBC
HEMATOCRIT: 33.4 % — AB (ref 36.0–46.0)
HEMOGLOBIN: 10.9 g/dL — AB (ref 12.0–15.0)
MCHC: 32.5 g/dL (ref 30.0–36.0)
MCV: 79.2 fl (ref 78.0–100.0)
Platelets: 309 10*3/uL (ref 150.0–400.0)
RBC: 4.22 Mil/uL (ref 3.87–5.11)
RDW: 16.3 % — ABNORMAL HIGH (ref 11.5–15.5)
WBC: 7.6 10*3/uL (ref 4.0–10.5)

## 2016-04-03 LAB — COMPREHENSIVE METABOLIC PANEL
ALT: 15 U/L (ref 0–35)
AST: 14 U/L (ref 0–37)
Albumin: 3.5 g/dL (ref 3.5–5.2)
Alkaline Phosphatase: 59 U/L (ref 39–117)
BUN: 10 mg/dL (ref 6–23)
CALCIUM: 9 mg/dL (ref 8.4–10.5)
CHLORIDE: 105 meq/L (ref 96–112)
CO2: 26 meq/L (ref 19–32)
CREATININE: 0.66 mg/dL (ref 0.40–1.20)
GFR: 144.7 mL/min (ref >60.00)
Glucose, Bld: 112 mg/dL — ABNORMAL HIGH (ref 70–99)
POTASSIUM: 3.6 meq/L (ref 3.5–5.1)
SODIUM: 137 meq/L (ref 135–145)
Total Bilirubin: 0.3 mg/dL (ref 0.2–1.2)
Total Protein: 7 g/dL (ref 6.0–8.3)

## 2016-04-03 LAB — HEMOGLOBIN A1C: Hgb A1c MFr Bld: 7.3 % — ABNORMAL HIGH (ref 4.6–6.5)

## 2016-04-03 LAB — VITAMIN B12: Vitamin B-12: 360 pg/mL (ref 211–911)

## 2016-04-03 LAB — TSH: TSH: 2.02 u[IU]/mL (ref 0.35–4.50)

## 2016-04-03 MED ORDER — CYCLOBENZAPRINE HCL 5 MG PO TABS: 5.0000 mg | ORAL_TABLET | Freq: Three times a day (TID) | ORAL | 1 refills | Status: DC | PRN

## 2016-04-03 NOTE — Assessment & Plan Note (Signed)
Weight is increased and we talked again about how this affects her health with the pre-diabetes and low back pain. She will work on more exercise and diet.

## 2016-04-03 NOTE — Patient Instructions (Addendum)
We are checking the labs today and will be checking you for diabetes. If this does show up we will need you to come back in 3-6 months instead of a year.   If you do end up having diabetes new we recommend for everybody to talk to a nutritionist at least once to find out more about it.   We have sent in the muscle relaxer flexeril for the back which you can use as needed.    Back Exercises The following exercises strengthen the muscles that help to support the back. They also help to keep the lower back flexible. Doing these exercises can help to prevent back pain or lessen existing pain. If you have back pain or discomfort, try doing these exercises 2-3 times each day or as told by your health care provider. When the pain goes away, do them once each day, but increase the number of times that you repeat the steps for each exercise (do more repetitions). If you do not have back pain or discomfort, do these exercises once each day or as told by your health care provider. Exercises Single Knee to Chest  Repeat these steps 3-5 times for each leg: 1. Lie on your back on a firm bed or the floor with your legs extended. 2. Bring one knee to your chest. Your other leg should stay extended and in contact with the floor. 3. Hold your knee in place by grabbing your knee or thigh. 4. Pull on your knee until you feel a gentle stretch in your lower back. 5. Hold the stretch for 10-30 seconds. 6. Slowly release and straighten your leg. Pelvic Tilt  Repeat these steps 5-10 times: 1. Lie on your back on a firm bed or the floor with your legs extended. 2. Bend your knees so they are pointing toward the ceiling and your feet are flat on the floor. 3. Tighten your lower abdominal muscles to press your lower back against the floor. This motion will tilt your pelvis so your tailbone points up toward the ceiling instead of pointing to your feet or the floor. 4. With gentle tension and even breathing, hold this  position for 5-10 seconds. Cat-Cow  Repeat these steps until your lower back becomes more flexible: 1. Get into a hands-and-knees position on a firm surface. Keep your hands under your shoulders, and keep your knees under your hips. You may place padding under your knees for comfort. 2. Let your head hang down, and point your tailbone toward the floor so your lower back becomes rounded like the back of a cat. 3. Hold this position for 5 seconds. 4. Slowly lift your head and point your tailbone up toward the ceiling so your back forms a sagging arch like the back of a cow. 5. Hold this position for 5 seconds. Press-Ups  Repeat these steps 5-10 times: 1. Lie on your abdomen (face-down) on the floor. 2. Place your palms near your head, about shoulder-width apart. 3. While you keep your back as relaxed as possible and keep your hips on the floor, slowly straighten your arms to raise the top half of your body and lift your shoulders. Do not use your back muscles to raise your upper torso. You may adjust the placement of your hands to make yourself more comfortable. 4. Hold this position for 5 seconds while you keep your back relaxed. 5. Slowly return to lying flat on the floor. Bridges  Repeat these steps 10 times: 1. Lie on your  back on a firm surface. 2. Bend your knees so they are pointing toward the ceiling and your feet are flat on the floor. 3. Tighten your buttocks muscles and lift your buttocks off of the floor until your waist is at almost the same height as your knees. You should feel the muscles working in your buttocks and the back of your thighs. If you do not feel these muscles, slide your feet 1-2 inches farther away from your buttocks. 4. Hold this position for 3-5 seconds. 5. Slowly lower your hips to the starting position, and allow your buttocks muscles to relax completely. If this exercise is too easy, try doing it with your arms crossed over your chest. Abdominal Crunches   Repeat these steps 5-10 times: 1. Lie on your back on a firm bed or the floor with your legs extended. 2. Bend your knees so they are pointing toward the ceiling and your feet are flat on the floor. 3. Cross your arms over your chest. 4. Tip your chin slightly toward your chest without bending your neck. 5. Tighten your abdominal muscles and slowly raise your trunk (torso) high enough to lift your shoulder blades a tiny bit off of the floor. Avoid raising your torso higher than that, because it can put too much stress on your low back and it does not help to strengthen your abdominal muscles. 6. Slowly return to your starting position. Back Lifts  Repeat these steps 5-10 times: 1. Lie on your abdomen (face-down) with your arms at your sides, and rest your forehead on the floor. 2. Tighten the muscles in your legs and your buttocks. 3. Slowly lift your chest off of the floor while you keep your hips pressed to the floor. Keep the back of your head in line with the curve in your back. Your eyes should be looking at the floor. 4. Hold this position for 3-5 seconds. 5. Slowly return to your starting position. Contact a health care provider if:  Your back pain or discomfort gets much worse when you do an exercise.  Your back pain or discomfort does not lessen within 2 hours after you exercise. If you have any of these problems, stop doing these exercises right away. Do not do them again unless your health care provider says that you can. Get help right away if:  You develop sudden, severe back pain. If this happens, stop doing the exercises right away. Do not do them again unless your health care provider says that you can. This information is not intended to replace advice given to you by your health care provider. Make sure you discuss any questions you have with your health care provider. Document Released: 03/09/2004 Document Revised: 06/09/2015 Document Reviewed: 03/26/2014 Elsevier  Interactive Patient Education  2017 [place] Maintenance, Female Introduction Adopting a healthy lifestyle and getting preventive care can go a long way to promote health and wellness. Talk with your health care provider about what schedule of regular examinations is right for you. This is a good chance for you to check in with your provider about disease prevention and staying healthy. In between checkups, there are plenty of things you can do on your own. Experts have done a lot of research about which lifestyle changes and preventive measures are most likely to keep you healthy. Ask your health care provider for more information. Weight and diet Eat a healthy diet  Be sure to include plenty of vegetables, fruits, low-fat dairy products,  and lean protein.  Do not eat a lot of foods high in solid fats, added sugars, or salt.  Get regular exercise. This is one of the most important things you can do for your health.  Most adults should exercise for at least 150 minutes each week. The exercise should increase your heart rate and make you sweat (moderate-intensity exercise).  Most adults should also do strengthening exercises at least twice a week. This is in addition to the moderate-intensity exercise. Maintain a healthy weight  Body mass index (BMI) is a measurement that can be used to identify possible weight problems. It estimates body fat based on height and weight. Your health care provider can help determine your BMI and help you achieve or maintain a healthy weight.  For females 90 years of age and older:  A BMI below 18.5 is considered underweight.  A BMI of 18.5 to 24.9 is normal.  A BMI of 25 to 29.9 is considered overweight.  A BMI of 30 and above is considered obese. Watch levels of cholesterol and blood lipids  You should start having your blood tested for lipids and cholesterol at 22 years of age, then have this test every 5 years.  You may need to have  your cholesterol levels checked more often if:  Your lipid or cholesterol levels are high.  You are older than 22 years of age.  You are at high risk for heart disease. Cancer screening Lung Cancer  Lung cancer screening is recommended for adults 79-50 years old who are at high risk for lung cancer because of a history of smoking.  A yearly low-dose CT scan of the lungs is recommended for people who:  Currently smoke.  Have quit within the past 15 years.  Have at least a 30-pack-year history of smoking. A pack year is smoking an average of one pack of cigarettes a day for 1 year.  Yearly screening should continue until it has been 15 years since you quit.  Yearly screening should stop if you develop a health problem that would prevent you from having lung cancer treatment. Breast Cancer  Practice breast self-awareness. This means understanding how your breasts normally appear and feel.  It also means doing regular breast self-exams. Let your health care provider know about any changes, no matter how small.  If you are in your 20s or 30s, you should have a clinical breast exam (CBE) by a health care provider every 1-3 years as part of a regular health exam.  If you are 48 or older, have a CBE every year. Also consider having a breast X-ray (mammogram) every year.  If you have a family history of breast cancer, talk to your health care provider about genetic screening.  If you are at high risk for breast cancer, talk to your health care provider about having an MRI and a mammogram every year.  Breast cancer gene (BRCA) assessment is recommended for women who have family members with BRCA-related cancers. BRCA-related cancers include:  Breast.  Ovarian.  Tubal.  Peritoneal cancers.  Results of the assessment will determine the need for genetic counseling and BRCA1 and BRCA2 testing. Cervical Cancer  Your health care provider may recommend that you be screened regularly  for cancer of the pelvic organs (ovaries, uterus, and vagina). This screening involves a pelvic examination, including checking for microscopic changes to the surface of your cervix (Pap test). You may be encouraged to have this screening done every 3 years, beginning at  age 74.  For women ages 3-65, health care providers may recommend pelvic exams and Pap testing every 3 years, or they may recommend the Pap and pelvic exam, combined with testing for human papilloma virus (HPV), every 5 years. Some types of HPV increase your risk of cervical cancer. Testing for HPV may also be done on women of any age with unclear Pap test results.  Other health care providers may not recommend any screening for nonpregnant women who are considered low risk for pelvic cancer and who do not have symptoms. Ask your health care provider if a screening pelvic exam is right for you.  If you have had past treatment for cervical cancer or a condition that could lead to cancer, you need Pap tests and screening for cancer for at least 20 years after your treatment. If Pap tests have been discontinued, your risk factors (such as having a new sexual partner) need to be reassessed to determine if screening should resume. Some women have medical problems that increase the chance of getting cervical cancer. In these cases, your health care provider may recommend more frequent screening and Pap tests. Colorectal Cancer  This type of cancer can be detected and often prevented.  Routine colorectal cancer screening usually begins at 22 years of age and continues through 22 years of age.  Your health care provider may recommend screening at an earlier age if you have risk factors for colon cancer.  Your health care provider may also recommend using home test kits to check for hidden blood in the stool.  A small camera at the end of a tube can be used to examine your colon directly (sigmoidoscopy or colonoscopy). This is done to check  for the earliest forms of colorectal cancer.  Routine screening usually begins at age 55.  Direct examination of the colon should be repeated every 5-10 years through 22 years of age. However, you may need to be screened more often if early forms of precancerous polyps or small growths are found. Skin Cancer  Check your skin from head to toe regularly.  Tell your health care provider about any new moles or changes in moles, especially if there is a change in a mole's shape or color.  Also tell your health care provider if you have a mole that is larger than the size of a pencil eraser.  Always use sunscreen. Apply sunscreen liberally and repeatedly throughout the day.  Protect yourself by wearing long sleeves, pants, a wide-brimmed hat, and sunglasses whenever you are outside. Heart disease, diabetes, and high blood pressure  High blood pressure causes heart disease and increases the risk of stroke. High blood pressure is more likely to develop in:  People who have blood pressure in the high end of the normal range (130-139/85-89 mm Hg).  People who are overweight or obese.  People who are African American.  If you are 53-21 years of age, have your blood pressure checked every 3-5 years. If you are 19 years of age or older, have your blood pressure checked every year. You should have your blood pressure measured twice-once when you are at a hospital or clinic, and once when you are not at a hospital or clinic. Record the average of the two measurements. To check your blood pressure when you are not at a hospital or clinic, you can use:  An automated blood pressure machine at a pharmacy.  A home blood pressure monitor.  If you are between 55 years and  58 years old, ask your health care provider if you should take aspirin to prevent strokes.  Have regular diabetes screenings. This involves taking a blood sample to check your fasting blood sugar level.  If you are at a normal weight  and have a low risk for diabetes, have this test once every three years after 22 years of age.  If you are overweight and have a high risk for diabetes, consider being tested at a younger age or more often. Preventing infection Hepatitis B  If you have a higher risk for hepatitis B, you should be screened for this virus. You are considered at high risk for hepatitis B if:  You were born in a country where hepatitis B is common. Ask your health care provider which countries are considered high risk.  Your parents were born in a high-risk country, and you have not been immunized against hepatitis B (hepatitis B vaccine).  You have HIV or AIDS.  You use needles to inject street drugs.  You live with someone who has hepatitis B.  You have had sex with someone who has hepatitis B.  You get hemodialysis treatment.  You take certain medicines for conditions, including cancer, organ transplantation, and autoimmune conditions. Hepatitis C  Blood testing is recommended for:  Everyone born from [birth year] through 1966.  Anyone with known risk factors for hepatitis C. Sexually transmitted infections (STIs)  You should be screened for sexually transmitted infections (STIs) including gonorrhea and chlamydia if:  You are sexually active and are younger than 22 years of age.  You are older than 22 years of age and your health care provider tells you that you are at risk for this type of infection.  Your sexual activity has changed since you were last screened and you are at an increased risk for chlamydia or gonorrhea. Ask your health care provider if you are at risk.  If you do not have HIV, but are at risk, it may be recommended that you take a prescription medicine daily to prevent HIV infection. This is called pre-exposure prophylaxis (PrEP). You are considered at risk if:  You are sexually active and do not regularly use condoms or know the HIV status of your partner(s).  You take drugs by  injection.  You are sexually active with a partner who has HIV. Talk with your health care provider about whether you are at high risk of being infected with HIV. If you choose to begin PrEP, you should first be tested for HIV. You should then be tested every 3 months for as long as you are taking PrEP. Pregnancy  If you are premenopausal and you may become pregnant, ask your health care provider about preconception counseling.  If you may become pregnant, take 400 to 800 micrograms (mcg) of folic acid every day.  If you want to prevent pregnancy, talk to your health care provider about birth control (contraception). Osteoporosis and menopause  Osteoporosis is a disease in which the bones lose minerals and strength with aging. This can result in serious bone fractures. Your risk for osteoporosis can be identified using a bone density scan.  If you are 70 years of age or older, or if you are at risk for osteoporosis and fractures, ask your health care provider if you should be screened.  Ask your health care provider whether you should take a calcium or vitamin D supplement to lower your risk for osteoporosis.  Menopause may have certain physical symptoms and risks.  Hormone replacement therapy may reduce some of these symptoms and risks. Talk to your health care provider about whether hormone replacement therapy is right for you. Follow these instructions at home:  Schedule regular health, dental, and eye exams.  Stay current with your immunizations.  Do not use any tobacco products including cigarettes, chewing tobacco, or electronic cigarettes.  If you are pregnant, do not drink alcohol.  If you are breastfeeding, limit how much and how often you drink alcohol.  Limit alcohol intake to no more than 1 drink per day for nonpregnant women. One drink equals 12 ounces of beer, 5 ounces of wine, or 1 ounces of hard liquor.  Do not use street drugs.  Do not share needles.  Ask  your health care provider for help if you need support or information about quitting drugs.  Tell your health care provider if you often feel depressed.  Tell your health care provider if you have ever been abused or do not feel safe at home. This information is not intended to replace advice given to you by your health care provider. Make sure you discuss any questions you have with your health care provider. Document Released: 08/15/2010 Document Revised: 07/08/2015 Document Reviewed: 11/03/2014  2017 [employer]
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
## 2016-04-03 NOTE — Assessment & Plan Note (Signed)
Checking labs, declines flu shot. Pap smear needed and talked to her about this. Counseled about sun safety and mole surveillance as well as exercise and diet. Given screening recommendations.

## 2016-04-03 NOTE — Progress Notes (Signed)
Pre visit review using our clinic review tool, if applicable. No additional management support is needed unless otherwise documented below in the visit note. 

## 2016-04-03 NOTE — Assessment & Plan Note (Signed)
Last HgA1c borderline for diabetes. Talked to her about the likely new diabetes diagnosis given the 30 pound weight gain in the last year. She does have family members with diabetes.

## 2016-07-04 ENCOUNTER — Ambulatory Visit: Payer: BLUE CROSS/BLUE SHIELD | Admitting: Internal Medicine

## 2017-09-17 ENCOUNTER — Emergency Department (HOSPITAL_COMMUNITY)
Admission: EM | Admit: 2017-09-17 | Discharge: 2017-09-17 | Disposition: A | Discharge status: Home / Self Care | Payer: Self-pay | Attending: Emergency Medicine | Admitting: Emergency Medicine

## 2017-09-17 ENCOUNTER — Emergency Department (HOSPITAL_COMMUNITY): Payer: Self-pay

## 2017-09-17 ENCOUNTER — Other Ambulatory Visit: Payer: Self-pay

## 2017-09-17 ENCOUNTER — Encounter (HOSPITAL_COMMUNITY): Payer: Self-pay | Admitting: Emergency Medicine

## 2017-09-17 DIAGNOSIS — F17210 Nicotine dependence, cigarettes, uncomplicated: Secondary | ICD-10-CM | POA: Insufficient documentation

## 2017-09-17 DIAGNOSIS — Z79899 Other long term (current) drug therapy: Secondary | ICD-10-CM | POA: Insufficient documentation

## 2017-09-17 DIAGNOSIS — M5431 Sciatica, right side: Secondary | ICD-10-CM | POA: Insufficient documentation

## 2017-09-17 LAB — POC URINE PREG, ED: PREG TEST UR: NEGATIVE

## 2017-09-17 MED ORDER — METHOCARBAMOL 500 MG PO TABS: 500.0000 mg | ORAL_TABLET | Freq: Two times a day (BID) | ORAL | 0 refills | Status: AC

## 2017-09-17 MED ORDER — PREDNISONE 20 MG PO TABS: 40.0000 mg | ORAL_TABLET | Freq: Every day | ORAL | 0 refills | Status: AC

## 2017-09-17 MED ORDER — KETOROLAC TROMETHAMINE 15 MG/ML IJ SOLN
15.0000 mg | Freq: Once | INTRAMUSCULAR | Status: AC
  Administered 2017-09-17: 15 mg via INTRAMUSCULAR
  Filled 2017-09-17: qty 1

## 2017-09-17 NOTE — Discharge Instructions (Signed)
Please return to the ED if you experience any of the following symptoms:  You cannot control when you pee (urinate) or poop (have a bowel movement). You have weakness in any of these areas and it gets worse. Lower back. Lower belly (pelvis). Butt (buttocks). Legs. You have redness or swelling of your back. You have a burning feeling when you pee.

## 2017-09-17 NOTE — ED Triage Notes (Signed)
Pt. Stated, Ive had right hip and leg pain for over 2 weeks, nothing is woring.

## 2017-09-17 NOTE — ED Provider Notes (Signed)
[facility] Provider Note   CSN: [id number] Arrival date & time: 09/17/17  1157     History   Chief Complaint Chief Complaint  Patient presents with  . Leg Pain  . Hip Pain    HPI Jade Mullins is a 22 y.o. female.  22 y.o female with a PMH of Sciatica presents to the ED with a chief complaint of right hip pain x 2 weeks ago.  She describes the pain as sharp mainly around her right buttocks area and radiating to the back of her right knee.  The pain is worse with standing still but improves with movement.  Has tried taking Motrin, muscle relaxers, placed icy hot to the area but states no relief in symptoms.  She denies any fever, bowel or bladder incontinence, chest pain or shortness of breath.     Past Medical History:  Diagnosis Date  . Sciatica     Patient Active Problem List   Diagnosis Date Noted  . Pre-diabetes 04/03/2016  . Morbid obesity (HCC) 03/30/2015  . Routine general medical examination at a health care facility 03/30/2015    History reviewed. No pertinent surgical history.   OB History   None      Home Medications    Prior to Admission medications   Medication Sig Start Date End Date Taking? Authorizing Provider  guaiFENesin (MUCINEX) 600 MG 12 hr tablet Take 2 tablets (1,200 mg total) by mouth 2 (two) times daily as needed. 06/22/15   Barbaraann BarthelBreen, James O, MD  methocarbamol (ROBAXIN) 500 MG tablet Take 1 tablet (500 mg total) by mouth 2 (two) times daily for 14 days. 09/17/17 10/01/17  Claude MangesSoto, Quianna Avery, PA-C  predniSONE (DELTASONE) 20 MG tablet Take 2 tablets (40 mg total) by mouth daily for 5 days. 09/17/17 09/22/17  Claude MangesSoto, Riham Polyakov, PA-C    Family History Family History  Problem Relation Age of Onset  . Arthritis Mother   . Diabetes Mother   . Diabetes Brother   . Hypertension Maternal Aunt   . Diabetes Maternal Aunt   . Cancer Maternal Aunt        prostate  . Hypertension Maternal Uncle   . Diabetes Maternal Uncle    . Arthritis Maternal Grandmother   . Heart disease Maternal Grandmother   . Hypertension Maternal Grandmother   . Diabetes Maternal Grandmother     Social History Social History   Tobacco Use  . Smoking status: Current Every Day Smoker    Packs/day: 0.50    Types: Cigarettes  . Smokeless tobacco: Never Used  Substance Use Topics  . Alcohol use: No    Alcohol/week: 0.0 oz  . Drug use: No     Allergies   Patient has no known allergies.   Review of Systems Review of Systems  Constitutional: Negative for chills and fever.  HENT: Negative for ear pain and sore throat.   Eyes: Negative for pain and visual disturbance.  Respiratory: Negative for cough and shortness of breath.   Cardiovascular: Negative for chest pain and palpitations.  Gastrointestinal: Negative for abdominal pain and vomiting.  Genitourinary: Negative for dysuria and hematuria.  Musculoskeletal: Positive for back pain and gait problem. Negative for arthralgias and myalgias.  Skin: Negative for color change and rash.  Neurological: Negative for seizures and syncope.  All other systems reviewed and are negative.    Physical Exam Updated Vital Signs BP 111/87 (BP Location: Right Arm)   Pulse 64   Temp 98.3 F (36.8  C) (Oral)   Resp 16   Ht 5\' 5"  (1.651 m)   Wt 117.9 kg (260 lb)   LMP 08/29/2017   SpO2 100%   BMI 43.27 kg/m   Physical Exam  Constitutional: She is oriented to person, place, and time. She appears well-developed and well-nourished. No distress.  HENT:  Head: Normocephalic and atraumatic.  Mouth/Throat: Oropharynx is clear and moist. No oropharyngeal exudate.  Eyes: Pupils are equal, round, and reactive to light.  Neck: Normal range of motion.  Cardiovascular: Regular rhythm and normal heart sounds.  Pulmonary/Chest: Effort normal and breath sounds normal. No respiratory distress.  Abdominal: Soft. Bowel sounds are normal. She exhibits no distension. There is no tenderness.    Musculoskeletal: She exhibits no tenderness or deformity.       Lumbar back: Normal. She exhibits no pain.       Back:       Right lower leg: She exhibits no edema.       Left lower leg: She exhibits no edema.  Neurological: She is alert and oriented to person, place, and time.  Skin: Skin is warm and dry. Capillary refill takes less than 2 seconds.  Psychiatric: She has a normal mood and affect.  Nursing note and vitals reviewed.    ED Treatments / Results  Labs (all labs ordered are listed, but only abnormal results are displayed) Labs Reviewed  POC URINE PREG, ED    EKG None  Radiology Dg Lumbar Spine 2-3 Views  Result Date: 09/17/2017 CLINICAL DATA:  Pain. EXAM: LUMBAR SPINE - 2-3 VIEW COMPARISON:  None. FINDINGS: There is no evidence of lumbar spine fracture. Alignment is normal. Intervertebral disc spaces are maintained. IMPRESSION: Normal exam. Electronically Signed   By: Francene Boyers M.D.   On: 09/17/2017 15:13    Procedures Procedures (including critical care time)  Medications Ordered in ED Medications  ketorolac (TORADOL) 15 MG/ML injection 15 mg (15 mg Intramuscular Given 09/17/17 1419)     Initial Impression / Assessment and Plan / ED Course  I have reviewed the triage vital signs and the nursing notes.  Pertinent labs & imaging results that were available during my care of the patient were reviewed by me and considered in my medical decision making (see chart for details).     Lumbar Xray showed no evidence of lumbar spine fracture. Patient denies any bladder or bowel incontinence.She states she has a previous history of sciatica and this feels like a flare up. I have prescribed steroids to decrease the swelling along with muscle relaxers. I have advised patient to follow up with PCP for reevaluation of symptoms. Return precautions provided.   Final Clinical Impressions(s) / ED Diagnoses   Final diagnoses:  Sciatica of right side    ED Discharge  Orders        Ordered    predniSONE (DELTASONE) 20 MG tablet  Daily     09/17/17 1516    methocarbamol (ROBAXIN) 500 MG tablet  2 times daily     09/17/17 1516       Claude Manges, PA-C 09/17/17 1522    Donnetta Hutching, MD 09/19/17 1100

## 2017-10-06 ENCOUNTER — Encounter (HOSPITAL_COMMUNITY): Payer: Self-pay | Admitting: Emergency Medicine

## 2017-10-06 ENCOUNTER — Emergency Department (HOSPITAL_COMMUNITY)
Admission: EM | Admit: 2017-10-06 | Discharge: 2017-10-06 | Disposition: A | Discharge status: Home / Self Care | Payer: No Typology Code available for payment source | Attending: Emergency Medicine | Admitting: Emergency Medicine

## 2017-10-06 DIAGNOSIS — F17210 Nicotine dependence, cigarettes, uncomplicated: Secondary | ICD-10-CM | POA: Insufficient documentation

## 2017-10-06 DIAGNOSIS — Y939 Activity, unspecified: Secondary | ICD-10-CM | POA: Diagnosis not present

## 2017-10-06 DIAGNOSIS — M5431 Sciatica, right side: Secondary | ICD-10-CM | POA: Diagnosis not present

## 2017-10-06 DIAGNOSIS — Y929 Unspecified place or not applicable: Secondary | ICD-10-CM | POA: Diagnosis not present

## 2017-10-06 DIAGNOSIS — M545 Low back pain: Secondary | ICD-10-CM | POA: Insufficient documentation

## 2017-10-06 DIAGNOSIS — Y999 Unspecified external cause status: Secondary | ICD-10-CM | POA: Diagnosis not present

## 2017-10-06 MED ORDER — METHOCARBAMOL 500 MG PO TABS: 500.0000 mg | ORAL_TABLET | Freq: Two times a day (BID) | ORAL | 0 refills | Status: DC

## 2017-10-06 MED ORDER — PREDNISONE 20 MG PO TABS: 40.0000 mg | ORAL_TABLET | Freq: Every day | ORAL | 0 refills | Status: DC
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
## 2017-10-06 NOTE — Discharge Instructions (Signed)
Take NSAIDs or Tylenol as needed for the next week. Take this medicine with food. Take muscle relaxer at bedtime to help you sleep. This medicine makes you drowsy so do not take before driving or work Take steroid as directed Use a heating pad for sore muscles - use for 20 minutes several times a day Return for worsening symptoms
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
## 2017-10-06 NOTE — ED Notes (Signed)
Declined W/C at D/C and was escorted to lobby by RN. 

## 2018-12-18 ENCOUNTER — Encounter (HOSPITAL_COMMUNITY): Payer: Self-pay | Admitting: Emergency Medicine

## 2018-12-18 ENCOUNTER — Ambulatory Visit (HOSPITAL_COMMUNITY)
Admission: EM | Admit: 2018-12-18 | Discharge: 2018-12-18 | Disposition: A | Discharge status: Home / Self Care | Payer: Self-pay | Attending: Family Medicine | Admitting: Family Medicine

## 2018-12-18 ENCOUNTER — Other Ambulatory Visit: Payer: Self-pay

## 2018-12-18 DIAGNOSIS — R739 Hyperglycemia, unspecified: Secondary | ICD-10-CM

## 2018-12-18 DIAGNOSIS — E119 Type 2 diabetes mellitus without complications: Secondary | ICD-10-CM

## 2018-12-18 DIAGNOSIS — E1165 Type 2 diabetes mellitus with hyperglycemia: Secondary | ICD-10-CM

## 2018-12-18 LAB — GLUCOSE, CAPILLARY: Glucose-Capillary: 499 mg/dL — ABNORMAL HIGH (ref 70–99)

## 2018-12-18 LAB — POCT URINALYSIS DIP (DEVICE)
Bilirubin Urine: NEGATIVE
Glucose, UA: 500 mg/dL — AB
Ketones, ur: 15 mg/dL — AB
Leukocytes,Ua: NEGATIVE
Nitrite: NEGATIVE
Protein, ur: NEGATIVE mg/dL
Specific Gravity, Urine: 1.01 (ref 1.005–1.030)
Urobilinogen, UA: 0.2 mg/dL (ref 0.0–1.0)
pH: 5.5 (ref 5.0–8.0)

## 2018-12-18 MED ORDER — METFORMIN HCL 500 MG PO TABS: 500.0000 mg | ORAL_TABLET | Freq: Two times a day (BID) | ORAL | 2 refills | Status: DC

## 2018-12-18 NOTE — ED Triage Notes (Signed)
Patient reports frequent urination, clear urine, feeling lethargic-all started last week.  Patient's mother is a diabetic and checked patient's sugar finding out it is 550 a few days ago.  Today 395 at 3:30pm today.

## 2018-12-18 NOTE — ED Provider Notes (Signed)
[facility]    CSN: [id number] Arrival date & time: 12/18/18  1719      History   Chief Complaint Chief Complaint  Patient presents with  . Hyperglycemia    HPI Jade Mullins is a 23 y.o. female.   HPI   Here for high blood sugar Has been feeling excessive thirst and frequent urination for a few weeks Mother is a diabetic and checked her sugar Last week it was over 500, this week it was 390 after trying diet changes Chart review indicates patient had an A1c value of 7.3 in 2018 but patient was told she had "prediabetes". She works two jobs at [employer] and [employer].  NO syncope, sweating, shakes, or other Sx. Has never had high BP or cholesterol  Past Medical History:  Diagnosis Date  . Sciatica     Patient Active Problem List   Diagnosis Date Noted  . Pre-diabetes 04/03/2016  . Morbid obesity ([place]) 03/30/2015  . Routine general medical examination at a health care facility 03/30/2015    History reviewed. No pertinent surgical history.  OB History   No obstetric history on file.      Home Medications    Prior to Admission medications   Medication Sig Start Date End Date Taking? Authorizing Provider  metFORMIN (GLUCOPHAGE) 500 MG tablet Take 1 tablet (500 mg total) by mouth 2 (two) times daily with a meal. 12/18/18   Raylene Everts, MD    Family History Family History  Problem Relation Age of Onset  . Arthritis Mother   . Diabetes Mother   . Diabetes Brother   . Hypertension Maternal Aunt   . Diabetes Maternal Aunt   . Cancer Maternal Aunt        prostate  . Hypertension Maternal Uncle   . Diabetes Maternal Uncle   . Arthritis Maternal Grandmother   . Heart disease Maternal Grandmother   . Hypertension Maternal Grandmother   . Diabetes Maternal Grandmother     Social History Social History   Tobacco Use  . Smoking status: Never Smoker  . Smokeless tobacco: Never Used  Substance Use Topics  . Alcohol use: Yes   Alcohol/week: 0.0 standard drinks  . Drug use: No     Allergies   Naproxen   Review of Systems Review of Systems  Constitutional: Positive for fatigue. Negative for chills, fever and unexpected weight change.  HENT: Negative for ear pain and sore throat.   Eyes: Negative for pain and visual disturbance.  Respiratory: Negative for cough and shortness of breath.   Cardiovascular: Negative for chest pain and palpitations.  Gastrointestinal: Negative for abdominal pain and vomiting.  Endocrine: Positive for polydipsia and polyuria.  Genitourinary: Negative for dysuria and hematuria.  Musculoskeletal: Negative for arthralgias and back pain.  Skin: Negative for color change and rash.  Neurological: Negative for seizures and syncope.  All other systems reviewed and are negative.    Physical Exam Triage Vital Signs ED Triage Vitals  Enc Vitals Group     BP 12/18/18 1738 (!) 128/93     Pulse Rate 12/18/18 1738 97     Resp 12/18/18 1738 20     Temp 12/18/18 1738 97.8 F (36.6 C)     Temp Source 12/18/18 1738 Oral     SpO2 12/18/18 1738 99 %     Weight --      Height --      Head Circumference --      Peak Flow --  Pain Score 12/18/18 1735 5     Pain Loc --      Pain Edu? --      Excl. in GC? --    No data found.  Updated Vital Signs BP (!) 128/93 (BP Location: Left Arm) Comment (BP Location): large cuff  Pulse 97   Temp 97.8 F (36.6 C) (Oral)   Resp 20   SpO2 99%     Physical Exam Constitutional:      General: She is not in acute distress.    Appearance: She is well-developed. She is obese. She is not ill-appearing.  HENT:     Head: Normocephalic and atraumatic.     Mouth/Throat:     Comments: Mask in place Eyes:     Conjunctiva/sclera: Conjunctivae normal.     Pupils: Pupils are equal, round, and reactive to light.  Neck:     Musculoskeletal: Normal range of motion.  Cardiovascular:     Rate and Rhythm: Normal rate and regular rhythm.     Heart  sounds: Normal heart sounds.  Pulmonary:     Effort: Pulmonary effort is normal. No respiratory distress.     Breath sounds: Normal breath sounds.  Abdominal:     General: There is no distension.     Palpations: Abdomen is soft.  Musculoskeletal: Normal range of motion.  Skin:    General: Skin is warm and dry.  Neurological:     Mental Status: She is alert.  Psychiatric:        Mood and Affect: Mood normal.        Behavior: Behavior normal.      UC Treatments / Results  Labs (all labs ordered are listed, but only abnormal results are displayed) Labs Reviewed  GLUCOSE, CAPILLARY - Abnormal; Notable for the following components:      Result Value   Glucose-Capillary 499 (*)    All other components within normal limits  POCT URINALYSIS DIP (DEVICE) - Abnormal; Notable for the following components:   Glucose, UA 500 (*)    Ketones, ur 15 (*)    Hgb urine dipstick LARGE (*)    All other components within normal limits  CBG MONITORING, ED    EKG   Radiology No results found.  Procedures Procedures (including critical care time)  Medications Ordered in UC Medications - No data to display  Initial Impression / Assessment and Plan / UC Course  I have reviewed the triage vital signs and the nursing notes.  Pertinent labs & imaging results that were available during my care of the patient were reviewed by me and considered in my medical decision making (see chart for details).     *reviewed the basics of diabetic diet, carbohydrates, sweet avoidance with recommended exercise 150 min a week.   Needs PCP, diabetes educator.   Final Clinical Impressions(s) / UC Diagnoses   Final diagnoses:  New onset type 2 diabetes mellitus (HCC)  Hyperglycemia     Discharge Instructions     Take the metformin 2 x a day with food - even a small amount Watch the sweets and carbohydrates in your diet Read the diet and exercise information enclosed Walk/exercise everyday that you  are able Follow up with a PCP as soon as you are able   ED Prescriptions    Medication Sig Dispense Auth. Provider   metFORMIN (GLUCOPHAGE) 500 MG tablet Take 1 tablet (500 mg total) by mouth 2 (two) times daily with a meal. 60 tablet Rica MastNelson, Roderick Sweezy  Fannie Knee, MD     PDMP not reviewed this encounter.   Eustace Moore, MD 12/18/18 Rickey Primus

## 2018-12-18 NOTE — Discharge Instructions (Addendum)
Take the metformin 2 x a day with food - even a small amount Watch the sweets and carbohydrates in your diet Read the diet and exercise information enclosed Walk/exercise everyday that you are able Follow up with a PCP as soon as you are able

## 2019-02-19 ENCOUNTER — Ambulatory Visit: Payer: Self-pay

## 2019-02-19 NOTE — Telephone Encounter (Signed)
Patient called stating that she is pre diabetic and taking metformin 500mg  twice per day.  She states she is having symptoms of blurred vision and sense that she is moving in slow motion.  She is unable to check her BS.  She state that thanksgiving it was over 500 not registering on her mother glucose monitor.  She states she used some of her mothers insulin and felt really weak after. Dec 1 she was able to check it again and her sugar was 377.  She states that she voids frequently and her urine is light colored like water.  She states that she sometimes forget to take her medication. Per protocol patient was advised that she needs to be seen in the ER for evaluation. Care advice read to patient.  She verbalized understanding of all information. She states she has someone how will drive. Reason for Disposition . [1] New onset diabetes suspected (e.g., frequent urination, weak, weight loss) AND [2] vomiting or rapid breathing  Answer Assessment - Initial Assessment Questions 1. BLOOD GLUCOSE: "What is your blood glucose level?"      unsure 2. ONSET: "When did you check the blood glucose?"    377 dec 1 3. USUAL RANGE: "What is your glucose level usually?" (e.g., usual fasting morning value, usual evening value)     unsure 4. KETONES: "Do you check for ketones (urine or blood test strips)?" If yes, ask: "What does the test show now?"      No 5. TYPE 1 or 2:  "Do you know what type of diabetes you have?"  (e.g., Type 1, Type 2, Gestational; doesn't know)      Pre diabetic 6. INSULIN: "Do you take insulin?" "What type of insulin(s) do you use? What is the mode of delivery? (syringe, pen; injection or pump)?"     None 7. DIABETES PILLS: "Do you take any pills for your diabetes?" If yes, ask: "Have you missed taking any pills recently?"   Metformin 500 2x daily 8. OTHER SYMPTOMS: "Do you have any symptoms?" (e.g., fever, frequent urination, difficulty breathing, dizziness, weakness, vomiting)   Blurry  vision,frequency,fatigue, 9. PREGNANCY: "Is there any chance you are pregnant?" "When was your last menstrual period?"     No ended yesterday regular monthly  Protocols used: DIABETES - HIGH BLOOD SUGAR-A-AH

## 2019-03-02 ENCOUNTER — Encounter (HOSPITAL_COMMUNITY): Payer: Self-pay | Admitting: *Deleted

## 2019-03-02 ENCOUNTER — Inpatient Hospital Stay (HOSPITAL_COMMUNITY)
Admission: EM | Admit: 2019-03-02 | Discharge: 2019-03-05 | DRG: 638 | Disposition: A | Discharge status: Home / Self Care | Payer: Self-pay | Attending: Internal Medicine | Admitting: Internal Medicine

## 2019-03-02 ENCOUNTER — Other Ambulatory Visit: Payer: Self-pay

## 2019-03-02 DIAGNOSIS — E1169 Type 2 diabetes mellitus with other specified complication: Secondary | ICD-10-CM | POA: Diagnosis present

## 2019-03-02 DIAGNOSIS — E119 Type 2 diabetes mellitus without complications: Secondary | ICD-10-CM | POA: Diagnosis present

## 2019-03-02 DIAGNOSIS — Z20822 Contact with and (suspected) exposure to covid-19: Secondary | ICD-10-CM | POA: Diagnosis present

## 2019-03-02 DIAGNOSIS — Z833 Family history of diabetes mellitus: Secondary | ICD-10-CM

## 2019-03-02 DIAGNOSIS — G4710 Hypersomnia, unspecified: Secondary | ICD-10-CM | POA: Diagnosis present

## 2019-03-02 DIAGNOSIS — Z7984 Long term (current) use of oral hypoglycemic drugs: Secondary | ICD-10-CM

## 2019-03-02 DIAGNOSIS — Z8249 Family history of ischemic heart disease and other diseases of the circulatory system: Secondary | ICD-10-CM

## 2019-03-02 DIAGNOSIS — Z6841 Body Mass Index (BMI) 40.0 and over, adult: Secondary | ICD-10-CM

## 2019-03-02 DIAGNOSIS — Z8261 Family history of arthritis: Secondary | ICD-10-CM

## 2019-03-02 DIAGNOSIS — Z9119 Patient's noncompliance with other medical treatment and regimen: Secondary | ICD-10-CM

## 2019-03-02 DIAGNOSIS — E1110 Type 2 diabetes mellitus with ketoacidosis without coma: Principal | ICD-10-CM | POA: Diagnosis present

## 2019-03-02 DIAGNOSIS — T383X6A Underdosing of insulin and oral hypoglycemic [antidiabetic] drugs, initial encounter: Secondary | ICD-10-CM | POA: Diagnosis present

## 2019-03-02 DIAGNOSIS — E876 Hypokalemia: Secondary | ICD-10-CM | POA: Diagnosis present

## 2019-03-02 HISTORY — DX: Type 2 diabetes mellitus with other specified complication: E11.69

## 2019-03-02 HISTORY — DX: Obesity, unspecified: E66.9

## 2019-03-02 HISTORY — DX: Morbid (severe) obesity due to excess calories: E66.01

## 2019-03-02 LAB — BASIC METABOLIC PANEL
Anion gap: 13 (ref 5–15)
Anion gap: 12 (ref 5–15)
BUN: <5 mg/dL — ABNORMAL LOW (ref 6–20)
BUN: <5 mg/dL — ABNORMAL LOW (ref 6–20)
CO2: 13 mmol/L — ABNORMAL LOW (ref 22–32)
CO2: 13 mmol/L — ABNORMAL LOW (ref 22–32)
Calcium: 8.7 mg/dL — ABNORMAL LOW (ref 8.9–10.3)
Calcium: 8 mg/dL — ABNORMAL LOW (ref 8.9–10.3)
Chloride: 113 mmol/L — ABNORMAL HIGH (ref 98–111)
Chloride: 109 mmol/L (ref 98–111)
Creatinine, Ser: 0.73 mg/dL (ref 0.44–1.00)
Creatinine, Ser: 0.69 mg/dL (ref 0.44–1.00)
GFR calc Af Amer: >60 mL/min (ref >60)
GFR calc Af Amer: >60 mL/min (ref >60)
GFR calc non Af Amer: >60 mL/min (ref >60)
GFR calc non Af Amer: >60 mL/min (ref >60)
Glucose, Bld: 54 mg/dL — ABNORMAL LOW (ref 70–99)
Glucose, Bld: 162 mg/dL — ABNORMAL HIGH (ref 70–99)
Potassium: 3.3 mmol/L — ABNORMAL LOW (ref 3.5–5.1)
Potassium: 3.8 mmol/L (ref 3.5–5.1)
Sodium: 139 mmol/L (ref 135–145)
Sodium: 134 mmol/L — ABNORMAL LOW (ref 135–145)

## 2019-03-02 LAB — URINALYSIS, ROUTINE W REFLEX MICROSCOPIC
Bacteria, UA: NONE SEEN
Bilirubin Urine: NEGATIVE
Glucose, UA: >=500 mg/dL — AB
Hgb urine dipstick: NEGATIVE
Ketones, ur: 80 mg/dL — AB
Leukocytes,Ua: NEGATIVE
Nitrite: NEGATIVE
Protein, ur: NEGATIVE mg/dL
Specific Gravity, Urine: 1.032 — ABNORMAL HIGH (ref 1.005–1.030)
pH: 5 (ref 5.0–8.0)

## 2019-03-02 LAB — GLUCOSE, CAPILLARY
Glucose-Capillary: 64 mg/dL — ABNORMAL LOW (ref 70–99)
Glucose-Capillary: 148 mg/dL — ABNORMAL HIGH (ref 70–99)
Glucose-Capillary: 141 mg/dL — ABNORMAL HIGH (ref 70–99)
Glucose-Capillary: 133 mg/dL — ABNORMAL HIGH (ref 70–99)
Glucose-Capillary: 136 mg/dL — ABNORMAL HIGH (ref 70–99)
Glucose-Capillary: 166 mg/dL — ABNORMAL HIGH (ref 70–99)
Glucose-Capillary: 185 mg/dL — ABNORMAL HIGH (ref 70–99)
Glucose-Capillary: 148 mg/dL — ABNORMAL HIGH (ref 70–99)

## 2019-03-02 LAB — BASIC METABOLIC PANEL WITH GFR
Anion gap: 12 (ref 5–15)
BUN: 6 mg/dL (ref 6–20)
CO2: 12 mmol/L — ABNORMAL LOW (ref 22–32)
Calcium: 8 mg/dL — ABNORMAL LOW (ref 8.9–10.3)
Chloride: 113 mmol/L — ABNORMAL HIGH (ref 98–111)
Creatinine, Ser: 0.74 mg/dL (ref 0.44–1.00)
GFR calc Af Amer: >60 mL/min
GFR calc non Af Amer: >60 mL/min
Glucose, Bld: 215 mg/dL — ABNORMAL HIGH (ref 70–99)
Potassium: 3.7 mmol/L (ref 3.5–5.1)
Sodium: 137 mmol/L (ref 135–145)

## 2019-03-02 LAB — I-STAT BETA HCG BLOOD, ED (MC, WL, AP ONLY): I-stat hCG, quantitative: <5 m[IU]/mL (ref <5)

## 2019-03-02 LAB — CBG MONITORING, ED
Glucose-Capillary: 533 mg/dL (ref 70–99)
Glucose-Capillary: 227 mg/dL — ABNORMAL HIGH (ref 70–99)
Glucose-Capillary: 267 mg/dL — ABNORMAL HIGH (ref 70–99)

## 2019-03-02 LAB — CBC
HCT: 42.1 % (ref 36.0–46.0)
Hemoglobin: 13.4 g/dL (ref 12.0–15.0)
MCH: 27 pg (ref 26.0–34.0)
MCHC: 31.8 g/dL (ref 30.0–36.0)
MCV: 84.7 fL (ref 80.0–100.0)
Platelets: 210 10*3/uL (ref 150–400)
RBC: 4.97 MIL/uL (ref 3.87–5.11)
RDW: 16.9 % — ABNORMAL HIGH (ref 11.5–15.5)
WBC: 6.9 10*3/uL (ref 4.0–10.5)
nRBC: 0 % (ref 0.0–0.2)

## 2019-03-02 LAB — COMPREHENSIVE METABOLIC PANEL WITH GFR
ALT: 19 U/L (ref 0–44)
AST: 19 U/L (ref 15–41)
Albumin: 3.6 g/dL (ref 3.5–5.0)
Alkaline Phosphatase: 117 U/L (ref 38–126)
Anion gap: 16 — ABNORMAL HIGH (ref 5–15)
BUN: 9 mg/dL (ref 6–20)
CO2: 15 mmol/L — ABNORMAL LOW (ref 22–32)
Calcium: 9.1 mg/dL (ref 8.9–10.3)
Chloride: 97 mmol/L — ABNORMAL LOW (ref 98–111)
Creatinine, Ser: 1.08 mg/dL — ABNORMAL HIGH (ref 0.44–1.00)
GFR calc Af Amer: >60 mL/min
GFR calc non Af Amer: >60 mL/min
Glucose, Bld: 557 mg/dL (ref 70–99)
Potassium: 4.5 mmol/L (ref 3.5–5.1)
Sodium: 128 mmol/L — ABNORMAL LOW (ref 135–145)
Total Bilirubin: 1.8 mg/dL — ABNORMAL HIGH (ref 0.3–1.2)
Total Protein: 7.3 g/dL (ref 6.5–8.1)

## 2019-03-02 LAB — POCT I-STAT EG7
Acid-base deficit: 14 mmol/L — ABNORMAL HIGH (ref 0.0–2.0)
Bicarbonate: 11.6 mmol/L — ABNORMAL LOW (ref 20.0–28.0)
Calcium, Ion: 1.17 mmol/L (ref 1.15–1.40)
HCT: 38 % (ref 36.0–46.0)
Hemoglobin: 12.9 g/dL (ref 12.0–15.0)
O2 Saturation: 91 %
Potassium: 3.7 mmol/L (ref 3.5–5.1)
Sodium: 139 mmol/L (ref 135–145)
TCO2: 12 mmol/L — ABNORMAL LOW (ref 22–32)
pCO2, Ven: 26.7 mmHg — ABNORMAL LOW (ref 44.0–60.0)
pH, Ven: 7.248 — ABNORMAL LOW (ref 7.250–7.430)
pO2, Ven: 70 mmHg — ABNORMAL HIGH (ref 32.0–45.0)

## 2019-03-02 LAB — RESPIRATORY PANEL BY RT PCR (FLU A&B, COVID)
Influenza A by PCR: NEGATIVE
Influenza B by PCR: NEGATIVE
SARS Coronavirus 2 by RT PCR: NEGATIVE

## 2019-03-02 LAB — HIV ANTIBODY (ROUTINE TESTING W REFLEX): HIV Screen 4th Generation wRfx: NONREACTIVE

## 2019-03-02 LAB — BETA-HYDROXYBUTYRIC ACID: Beta-Hydroxybutyric Acid: 4.34 mmol/L — ABNORMAL HIGH (ref 0.05–0.27)

## 2019-03-02 LAB — LIPASE, BLOOD: Lipase: 25 U/L (ref 11–51)

## 2019-03-02 MED ORDER — INSULIN ASPART 100 UNIT/ML ~~LOC~~ SOLN
10.0000 [IU] | Freq: Once | SUBCUTANEOUS | Status: AC
  Administered 2019-03-02: 10 [IU] via INTRAVENOUS

## 2019-03-02 MED ORDER — DEXTROSE 50 % IV SOLN
0.0000 mL | INTRAVENOUS | Status: DC | PRN
  Administered 2019-03-02: 50 mL via INTRAVENOUS
  Filled 2019-03-02: qty 50

## 2019-03-02 MED ORDER — POTASSIUM CHLORIDE 10 MEQ/100ML IV SOLN
10.0000 meq | INTRAVENOUS | Status: AC
  Administered 2019-03-02: 10 meq via INTRAVENOUS
  Filled 2019-03-02: qty 100

## 2019-03-02 MED ORDER — LIVING WELL WITH DIABETES BOOK
Freq: Once | Status: AC
  Filled 2019-03-02: qty 1

## 2019-03-02 MED ORDER — INSULIN REGULAR(HUMAN) IN NACL 100-0.9 UT/100ML-% IV SOLN
INTRAVENOUS | Status: DC
  Administered 2019-03-02: 15 [IU]/h via INTRAVENOUS
  Administered 2019-03-03: 7.5 [IU]/h via INTRAVENOUS
  Filled 2019-03-02 (×2): qty 100

## 2019-03-02 MED ORDER — SODIUM CHLORIDE 0.9 % IV BOLUS
2000.0000 mL | Freq: Once | INTRAVENOUS | Status: AC
  Administered 2019-03-02: 2000 mL via INTRAVENOUS

## 2019-03-02 MED ORDER — ENOXAPARIN SODIUM 40 MG/0.4ML ~~LOC~~ SOLN
40.0000 mg | SUBCUTANEOUS | Status: DC
  Administered 2019-03-02 – 2019-03-04 (×3): 40 mg via SUBCUTANEOUS
  Filled 2019-03-02 (×4): qty 0.4

## 2019-03-02 MED ORDER — DEXTROSE-NACL 5-0.45 % IV SOLN: INTRAVENOUS | Status: DC

## 2019-03-02 MED ORDER — SODIUM CHLORIDE 0.9 % IV SOLN: INTRAVENOUS | Status: DC

## 2019-03-02 MED ORDER — SODIUM CHLORIDE 0.9% FLUSH: 3.0000 mL | Freq: Once | INTRAVENOUS | Status: DC

## 2019-03-02 NOTE — Progress Notes (Signed)
Inpatient Diabetes Program Recommendations  AACE/ADA: New Consensus Statement on Inpatient Glycemic Control (2015)  Target Ranges:  Prepandial:   less than 140 mg/dL      Peak postprandial:   less than 180 mg/dL (1-2 hours)      Critically ill patients:  140 - 180 mg/dL   Lab Results  Component Value Date   GLUCAP 141 (H) 03/02/2019   HGBA1C 7.3 (H) 04/03/2016    Review of Glycemic Control  Diabetes history: DM2 Outpatient Diabetes medications: metformin 500 mg bid Current orders for Inpatient glycemic control: IV insulin per [facility]  AG - closed. CO2 - 13 Hypoglycemia with Glucose 54. HgbA1C pending. Pt has not taken metformin in several weeks.  Inpatient Diabetes Program Recommendations:     Not ready to transition off insulin drip. (drip rate 3 units/H) Ordered Living Well with Diabetes book  Pt will likely need to be discharged home on affordable insulin. Does not have PCP. No insurance.  Will need to focus on weight loss, exercise to better control blood sugars.  Will speak with pt on 1/18.  Thank you. Ailene Ards, RD, LDN, CDE Inpatient Diabetes Coordinator [phone]

## 2019-03-02 NOTE — ED Notes (Signed)
4E unable to take report at this time.  

## 2019-03-02 NOTE — H&P (Signed)
History and Physical    Jade Mullins:[id number] DOB: 1995-04-14 DOA: 03/02/2019  PCP: Patient, No Pcp Per Consultants:  None Patient coming from:  Home - lives with best friend; NOK: Mother, [phone]  Chief Complaint: Hyperglycemia  HPI: Jade Mullins is a 24 y.o. female with medical history significant of sciatica; DM; and morbid obesity (BMI 40) presenting with hyperglycemia after running out of Metformin a couple of weeks ago.   She was diagnosed with diabetes in November and started her on Metformin.  They recommended that she lose weight, change her eating habits, and told her to find a PCP and start exercising.  She was 270 in October and tried to make those changes and she in now 75.  She woke up about a month ago and saw black dots.  At the end of December, she woke up and saw images moving when they were still.  She started getting cotton mouth, headaches.  She called the hospital and was told she should come to the ER now.  She doesn't have a glucometer.  She didn't come to the ER.  Last week, her tongue was burning and red, ?thrush.  She started sleeping more and got abdominal pain, checked glucose last in December on mom's meter and it was 377.   ED Course:  Trying to not admit; started on Metformin in Nov and ran out a week ago.  Mild DKA, plan for d/c after insulin and IVF - but CO2 dropped to 12.  VBG pending.  Review of Systems: As per HPI; otherwise review of systems reviewed and negative.   Ambulatory Status:  Ambulates without assistance  Past Medical History:  Diagnosis Date  . Diabetes mellitus type 2 in obese (HCC)   . Morbid obesity with BMI of 40.0-44.9, adult (HCC)   . Sciatica     History reviewed. No pertinent surgical history.  Social History   Socioeconomic History  . Marital status: Single    Spouse name: Not on file  . Number of children: Not on file  . Years of education: Not on file  . Highest education level: Not on file    Occupational History  . Occupation: Production designer, theatre/television/film at [employer]  Tobacco Use  . Smoking status: Former Smoker    Packs/day: 0.00  . Smokeless tobacco: Never Used  . Tobacco comment: "like a month"  Substance and Sexual Activity  . Alcohol use: Yes    Alcohol/week: 0.0 standard drinks    Comment: none since 11/20  . Drug use: Not Currently    Types: Marijuana    Comment: last use over 6 months ago  . Sexual activity: Not Currently  Other Topics Concern  . Not on file  Social History Narrative  . Not on file   Social Determinants of Health   Financial Resource Strain:   . Difficulty of Paying Living Expenses: Not on file  Food Insecurity:   . Worried About Programme researcher, broadcasting/film/video in the Last Year: Not on file  . Ran Out of Food in the Last Year: Not on file  Transportation Needs:   . Lack of Transportation (Medical): Not on file  . Lack of Transportation (Non-Medical): Not on file  Physical Activity:   . Days of Exercise per Week: Not on file  . Minutes of Exercise per Session: Not on file  Stress:   . Feeling of Stress : Not on file  Social Connections:   . Frequency of Communication with Friends and Family: Not  on file  . Frequency of Social Gatherings with Friends and Family: Not on file  . Attends Religious Services: Not on file  . Active Member of Clubs or Organizations: Not on file  . Attends Archivist Meetings: Not on file  . Marital Status: Not on file  Intimate Partner Violence:   . Fear of Current or Ex-Partner: Not on file  . Emotionally Abused: Not on file  . Physically Abused: Not on file  . Sexually Abused: Not on file    Allergies  Allergen Reactions  . Naproxen     Lips swelling    Family History  Problem Relation Age of Onset  . Arthritis Mother   . Diabetes Mother   . Diabetes Brother   . Hypertension Maternal Aunt   . Diabetes Maternal Aunt   . Cancer Maternal Aunt        prostate  . Hypertension Maternal Uncle   . Diabetes  Maternal Uncle   . Arthritis Maternal Grandmother   . Heart disease Maternal Grandmother   . Hypertension Maternal Grandmother   . Diabetes Maternal Grandmother     Prior to Admission medications   Medication Sig Start Date End Date Taking? Authorizing Provider  metFORMIN (GLUCOPHAGE) 500 MG tablet Take 1 tablet (500 mg total) by mouth 2 (two) times daily with a meal. 12/18/18  Yes Raylene Everts, MD    Physical Exam: Vitals:   03/02/19 1345 03/02/19 1354 03/02/19 1430 03/02/19 1551  BP:  (!) 116/91 (!) 107/95 (!) 116/99  Pulse: 84 81 88 91  Resp:    18  Temp:    98.2 F (36.8 C)  TempSrc:    Oral  SpO2: 100% 100% 99% 100%  Weight:      Height:         . General:  Appears calm and comfortable and is NAD . Eyes:  PERRL, EOMI, normal lids, iris . ENT:  grossly normal hearing, lips & tongue, mmm; appropriate dentition . Neck:  no LAD, masses or thyromegaly . Cardiovascular:  RRR, no m/r/g. No LE edema.  Marland Kitchen Respiratory:   CTA bilaterally with no wheezes/rales/rhonchi.  Normal respiratory effort. . Abdomen:  soft, NT, ND, NABS . Skin:  no rash or induration seen on limited exam . Musculoskeletal:  grossly normal tone BUE/BLE, good ROM, no bony abnormality . Psychiatric:  grossly normal mood and affect, speech fluent and appropriate, AOx3 . Neurologic:  CN 2-12 grossly intact, moves all extremities in coordinated fashion, sensation intact    Radiological Exams on Admission: No results found.  EKG: not done   Labs on Admission: I have personally reviewed the available labs and imaging studies at the time of the admission.  Pertinent labs:   Na++ 128 -> 137 -> 139 CO2 15 -> 12 -> 13 Glucose 557 -> 215 Creatinine 1.08 -> 0.74 Anion gap 16 -> 12 Normal CBC Negative Upreg UA: >500 glucose; 80 ketones Beta-hydroxybutyrate 4.34 Respiratory panel PCR negative VBG: 7.248/26.7/11.6  Assessment/Plan Principal Problem:   DKA (diabetic ketoacidosis) (HCC) Active  Problems:   Morbid obesity with BMI of 40.0-44.9, adult ([place])   Diabetes mellitus type 2 in obese [facility])   DKA -Patient with recent diagnosis in November, reports she was told she had "prediabetes" with a glucose 500 -She was started on Metformin but not given glucometer and did not have f/u arranged -She has intermittently checked her blood sugar with her mom's glucometer -She did not find a PCP -She ran out  of Metformin -No indication of illness as source -Moderate DKA on admission based on pH 7.248, HCO3 12, patient alert  -Will observe in SDU with DKA protocol -Will transition to SQ insulin once her CO2 normalizes -K+ slightly low at time of admission so potassium supplementation added -IVF at 150 cc/hr, NS until glucose <250 and then decrease rate to 125 and change to D51/2NS -A1c is pending -Diabetes education requested -TOC team consult for [facility]  Morbid obesity -BMI 40.4 -Weight loss should be encouraged -Outpatient PCP/bariatric medicine/bariatric surgery f/u encouraged    Note: This patient has been tested and is negative for the novel coronavirus COVID-19.  DVT prophylaxis:  Lovenox  Code Status:  Full - confirmed with patient Family Communication: None present Disposition Plan:  Home once clinically improved, likely tomorrow Consults called: DM Coordinator; TOC team Admission status: It is my clinical opinion that referral for OBSERVATION is reasonable and necessary in this patient based on the above information provided. The aforementioned taken together are felt to place the patient at high risk for further clinical deterioration. However it is anticipated that the patient may be medically stable for discharge from the hospital within 24 to 48 hours.    Jonah Blue MD [facility]   How to contact the [facility] Attending or Consulting provider 7A - 7P or covering provider during after hours 7P -7A, for  this patient?  1. Check the care team in [facility] and look for a) attending/consulting TRH provider listed and b) the [facility] team listed 2. Log into [email or web address] and use [place] universal password to access. If you do not have the password, please contact the hospital operator. 3. Locate the [facility] provider you are looking for under [facility] and page to a number that you can be directly reached. 4. If you still have difficulty reaching the provider, please page the [facility] (Director on Call) for the Hospitalists listed on amion for assistance.   03/02/2019, 5:49 PM

## 2019-03-02 NOTE — Progress Notes (Signed)
Received report from ED. Will await pt. Lacy Duverney, RN

## 2019-03-02 NOTE — Progress Notes (Signed)
Pt received from ED. Pt c/a/ox4. CHG bath given. Telebox applied/ccmd notified. Vitals stable. Pt oriented to room and call bell within reach. Will continue to monitor.  Lacy Duverney, RN.

## 2019-03-02 NOTE — ED Provider Notes (Signed)
[facility] EMERGENCY DEPARTMENT Provider Note   CSN: [id number] Arrival date & time: 03/02/19  2440     History Chief Complaint  Patient presents with  . Hyperglycemia    Jade Mullins is a 24 y.o. female.  The history is provided by the patient.  Hyperglycemia Blood sugar level PTA:  >500 Severity:  Mild Onset quality:  Gradual Timing:  Constant Progression:  Unchanged Chronicity:  New Current diabetic therapy:  Metformin Time since last antidiabetic medication:  2 weeks Context: new diabetes diagnosis and noncompliance   Relieved by:  Nothing Ineffective treatments:  None tried Associated symptoms: dizziness, increased appetite, increased thirst and polyuria   Associated symptoms: no abdominal pain, no altered mental status, no chest pain, no dysuria, no fever, no shortness of breath and no vomiting   Risk factors: no hx of DKA        Past Medical History:  Diagnosis Date  . Diabetes mellitus type 2 in obese (HCC)   . Morbid obesity with BMI of 40.0-44.9, adult (HCC)   . Sciatica     Patient Active Problem List   Diagnosis Date Noted  . DKA (diabetic ketoacidosis) (HCC) 03/02/2019  . Pre-diabetes 04/03/2016  . Morbid obesity (HCC) 03/30/2015  . Routine general medical examination at a health care facility 03/30/2015    History reviewed. No pertinent surgical history.   OB History   No obstetric history on file.     Family History  Problem Relation Age of Onset  . Arthritis Mother   . Diabetes Mother   . Diabetes Brother   . Hypertension Maternal Aunt   . Diabetes Maternal Aunt   . Cancer Maternal Aunt        prostate  . Hypertension Maternal Uncle   . Diabetes Maternal Uncle   . Arthritis Maternal Grandmother   . Heart disease Maternal Grandmother   . Hypertension Maternal Grandmother   . Diabetes Maternal Grandmother     Social History   Tobacco Use  . Smoking status: Former Smoker    Packs/day: 0.00  . Smokeless  tobacco: Never Used  . Tobacco comment: "like a month"  Substance Use Topics  . Alcohol use: Yes    Alcohol/week: 0.0 standard drinks    Comment: none since 11/20  . Drug use: Not Currently    Types: Marijuana    Comment: last use over 6 months ago    Home Medications Prior to Admission medications   Medication Sig Start Date End Date Taking? Authorizing Provider  metFORMIN (GLUCOPHAGE) 500 MG tablet Take 1 tablet (500 mg total) by mouth 2 (two) times daily with a meal. 12/18/18  Yes Eustace Moore, MD    Allergies    Naproxen  Review of Systems   Review of Systems  Constitutional: Negative for chills and fever.  HENT: Negative for ear pain and sore throat.   Eyes: Negative for pain and visual disturbance.  Respiratory: Negative for cough and shortness of breath.   Cardiovascular: Negative for chest pain and palpitations.  Gastrointestinal: Negative for abdominal pain and vomiting.  Endocrine: Positive for polydipsia and polyuria.  Genitourinary: Negative for dysuria and hematuria.  Musculoskeletal: Negative for arthralgias and back pain.  Skin: Negative for color change and rash.  Neurological: Positive for dizziness. Negative for seizures and syncope.  All other systems reviewed and are negative.   Physical Exam Updated Vital Signs  ED Triage Vitals  Enc Vitals Group     BP 03/02/19 1027 Marland Kitchen)  128/107     Pulse Rate 03/02/19 0624 (!) 101     Resp 03/02/19 0624 18     Temp 03/02/19 0624 97.7 F (36.5 C)     Temp Source 03/02/19 0624 Oral     SpO2 03/02/19 0624 100 %     Weight 03/02/19 0634 243 lb (110.2 kg)     Height 03/02/19 0634 5\' 5"  (1.651 m)     Head Circumference --      Peak Flow --      Pain Score 03/02/19 0633 0     Pain Loc --      Pain Edu? --      Excl. in GC? --     Physical Exam Vitals and nursing note reviewed.  Constitutional:      General: She is not in acute distress.    Appearance: She is well-developed. She is not ill-appearing.   HENT:     Head: Normocephalic and atraumatic.     Nose: Nose normal.     Mouth/Throat:     Mouth: Mucous membranes are dry.  Eyes:     Extraocular Movements: Extraocular movements intact.     Conjunctiva/sclera: Conjunctivae normal.     Pupils: Pupils are equal, round, and reactive to light.  Cardiovascular:     Rate and Rhythm: Normal rate and regular rhythm.     Pulses: Normal pulses.     Heart sounds: Normal heart sounds. No murmur.  Pulmonary:     Effort: Pulmonary effort is normal. No respiratory distress.     Breath sounds: Normal breath sounds.  Abdominal:     Palpations: Abdomen is soft.     Tenderness: There is no abdominal tenderness.  Musculoskeletal:     Cervical back: Normal range of motion and neck supple.  Skin:    General: Skin is warm and dry.     Capillary Refill: Capillary refill takes less than 2 seconds.  Neurological:     General: No focal deficit present.     Mental Status: She is alert.  Psychiatric:        Mood and Affect: Mood normal.     ED Results / Procedures / Treatments   Labs (all labs ordered are listed, but only abnormal results are displayed) Labs Reviewed  COMPREHENSIVE METABOLIC PANEL - Abnormal; Notable for the following components:      Result Value   Sodium 128 (*)    Chloride 97 (*)    CO2 15 (*)    Glucose, Bld 557 (*)    Creatinine, Ser 1.08 (*)    Total Bilirubin 1.8 (*)    Anion gap 16 (*)    All other components within normal limits  CBC - Abnormal; Notable for the following components:   RDW 16.9 (*)    All other components within normal limits  URINALYSIS, ROUTINE W REFLEX MICROSCOPIC - Abnormal; Notable for the following components:   Color, Urine COLORLESS (*)    Specific Gravity, Urine 1.032 (*)    Glucose, UA >=500 (*)    Ketones, ur 80 (*)    All other components within normal limits  BASIC METABOLIC PANEL - Abnormal; Notable for the following components:   Chloride 113 (*)    CO2 12 (*)    Glucose, Bld  215 (*)    Calcium 8.0 (*)    All other components within normal limits  CBG MONITORING, ED - Abnormal; Notable for the following components:   Glucose-Capillary 533 (*)  All other components within normal limits  CBG MONITORING, ED - Abnormal; Notable for the following components:   Glucose-Capillary 227 (*)    All other components within normal limits  POCT I-STAT EG7 - Abnormal; Notable for the following components:   pH, Ven 7.248 (*)    pCO2, Ven 26.7 (*)    pO2, Ven 70.0 (*)    Bicarbonate 11.6 (*)    TCO2 12 (*)    Acid-base deficit 14.0 (*)    All other components within normal limits  RESPIRATORY PANEL BY RT PCR (FLU A&B, COVID)  LIPASE, BLOOD  BLOOD GAS, VENOUS  I-STAT BETA HCG BLOOD, ED ([facility], [facility], [facility] ONLY)    EKG None  Radiology No results found.  Procedures .Critical Care Performed by: Virgina Norfolk, DO Authorized by: Virgina Norfolk, DO   Critical care provider statement:    Critical care time (minutes):  45   Critical care was necessary to treat or prevent imminent or life-threatening deterioration of the following conditions:  Metabolic crisis   Critical care was time spent personally by me on the following activities:  Blood draw for specimens, development of treatment plan with patient or surrogate, evaluation of patient's response to treatment, discussions with primary provider, examination of patient, obtaining history from patient or surrogate, ordering and performing treatments and interventions, ordering and review of laboratory studies, ordering and review of radiographic studies, pulse oximetry, re-evaluation of patient's condition and review of old charts   I assumed direction of critical care for this patient from another provider in my specialty: no     (including critical care time)  Medications Ordered in ED Medications  sodium chloride flush (NS) 0.9 % injection 3 mL (has no administration in time range)  sodium chloride 0.9 % bolus 2,000 mL  (0 mLs Intravenous Stopped 03/02/19 1108)  insulin aspart (novoLOG) injection 10 Units (10 Units Intravenous Given 03/02/19 0940)    ED Course  I have reviewed the triage vital signs and the nursing notes.  Pertinent labs & imaging results that were available during my care of the patient were reviewed by me and considered in my medical decision making (see chart for details).    MDM Rules/Calculators/A&P  Jade Mullins is a 24 year old female with history of diabetes who presents to the ED with hyperglycemia.  Patient with overall unremarkable vitals.  No fever.  Patient takes Metformin but has not had the prescription for about a week.  She was given this medicine from urgent care provider.  She does not have a primary care provider.  She has had increased thirst and urination.  Lab work is already done prior to my evaluation.  Overall she has very mild DKA.  Bicarb is 15.  Has ketones.  However anion gap 16.  Overall she appears clinically hydrated.  I believe that we could help her blood sugar around with IV fluids and insulin and have her follow-up with endocrinology outpatient.  Will prescribe Metformin.  However at this time will give IV fluid hydration, insulin and recheck a BMP to make sure that lab work is improving.  Patient with improvement of blood sugar 215.  However bicarb continues to downtrend to 12.  Anion gap has closed however will admit patient for mild DKA after discussion with hospitalist and due to the fact that patient also has poor follow-up.  VBG does show ongoing acidosis as well.  This chart was dictated using voice recognition software.  Despite best efforts to proofread,  errors  can occur which can change the documentation meaning.    Final Clinical Impression(s) / ED Diagnoses Final diagnoses:  Diabetic ketoacidosis without coma associated with type 2 diabetes mellitus [facility])    Rx / DC Orders ED Discharge Orders    None       Lennice Sites, DO 03/02/19  1337

## 2019-03-02 NOTE — ED Notes (Signed)
Blood sugar 533, triage RN notified.

## 2019-03-02 NOTE — ED Triage Notes (Signed)
The pt is a diabetic since november

## 2019-03-02 NOTE — ED Triage Notes (Signed)
The pt received metformin from ucc  She has been out of the rx for one week  She feels like her blood sugar is high lmp 2 weeks

## 2019-03-03 DIAGNOSIS — E1110 Type 2 diabetes mellitus with ketoacidosis without coma: Principal | ICD-10-CM

## 2019-03-03 DIAGNOSIS — E1169 Type 2 diabetes mellitus with other specified complication: Secondary | ICD-10-CM

## 2019-03-03 DIAGNOSIS — E669 Obesity, unspecified: Secondary | ICD-10-CM

## 2019-03-03 DIAGNOSIS — Z6841 Body Mass Index (BMI) 40.0 and over, adult: Secondary | ICD-10-CM

## 2019-03-03 LAB — BASIC METABOLIC PANEL
Anion gap: 10 (ref 5–15)
Anion gap: 12 (ref 5–15)
Anion gap: 13 (ref 5–15)
Anion gap: 13 (ref 5–15)
Anion gap: 8 (ref 5–15)
Anion gap: 9 (ref 5–15)
BUN: <5 mg/dL — ABNORMAL LOW (ref 6–20)
BUN: <5 mg/dL — ABNORMAL LOW (ref 6–20)
BUN: <5 mg/dL — ABNORMAL LOW (ref 6–20)
BUN: <5 mg/dL — ABNORMAL LOW (ref 6–20)
BUN: <5 mg/dL — ABNORMAL LOW (ref 6–20)
BUN: <5 mg/dL — ABNORMAL LOW (ref 6–20)
CO2: 12 mmol/L — ABNORMAL LOW (ref 22–32)
CO2: 14 mmol/L — ABNORMAL LOW (ref 22–32)
CO2: 15 mmol/L — ABNORMAL LOW (ref 22–32)
CO2: 17 mmol/L — ABNORMAL LOW (ref 22–32)
CO2: 18 mmol/L — ABNORMAL LOW (ref 22–32)
CO2: 18 mmol/L — ABNORMAL LOW (ref 22–32)
Calcium: 8.2 mg/dL — ABNORMAL LOW (ref 8.9–10.3)
Calcium: 8.2 mg/dL — ABNORMAL LOW (ref 8.9–10.3)
Calcium: 8.4 mg/dL — ABNORMAL LOW (ref 8.9–10.3)
Calcium: 8.8 mg/dL — ABNORMAL LOW (ref 8.9–10.3)
Calcium: 8.9 mg/dL (ref 8.9–10.3)
Calcium: 8.9 mg/dL (ref 8.9–10.3)
Chloride: 106 mmol/L (ref 98–111)
Chloride: 107 mmol/L (ref 98–111)
Chloride: 108 mmol/L (ref 98–111)
Chloride: 108 mmol/L (ref 98–111)
Chloride: 109 mmol/L (ref 98–111)
Chloride: 110 mmol/L (ref 98–111)
Creatinine, Ser: 0.53 mg/dL (ref 0.44–1.00)
Creatinine, Ser: 0.58 mg/dL (ref 0.44–1.00)
Creatinine, Ser: 0.62 mg/dL (ref 0.44–1.00)
Creatinine, Ser: 0.65 mg/dL (ref 0.44–1.00)
Creatinine, Ser: 0.67 mg/dL (ref 0.44–1.00)
Creatinine, Ser: 0.67 mg/dL (ref 0.44–1.00)
GFR calc Af Amer: >60 mL/min (ref >60)
GFR calc Af Amer: >60 mL/min (ref >60)
GFR calc Af Amer: >60 mL/min (ref >60)
GFR calc Af Amer: >60 mL/min (ref >60)
GFR calc Af Amer: >60 mL/min (ref >60)
GFR calc Af Amer: >60 mL/min (ref >60)
GFR calc non Af Amer: >60 mL/min (ref >60)
GFR calc non Af Amer: >60 mL/min (ref >60)
GFR calc non Af Amer: >60 mL/min (ref >60)
GFR calc non Af Amer: >60 mL/min (ref >60)
GFR calc non Af Amer: >60 mL/min (ref >60)
GFR calc non Af Amer: >60 mL/min (ref >60)
Glucose, Bld: 151 mg/dL — ABNORMAL HIGH (ref 70–99)
Glucose, Bld: 159 mg/dL — ABNORMAL HIGH (ref 70–99)
Glucose, Bld: 180 mg/dL — ABNORMAL HIGH (ref 70–99)
Glucose, Bld: 238 mg/dL — ABNORMAL HIGH (ref 70–99)
Glucose, Bld: 258 mg/dL — ABNORMAL HIGH (ref 70–99)
Glucose, Bld: 266 mg/dL — ABNORMAL HIGH (ref 70–99)
Potassium: 3 mmol/L — ABNORMAL LOW (ref 3.5–5.1)
Potassium: 3.2 mmol/L — ABNORMAL LOW (ref 3.5–5.1)
Potassium: 3.5 mmol/L (ref 3.5–5.1)
Potassium: 3.6 mmol/L (ref 3.5–5.1)
Potassium: 3.7 mmol/L (ref 3.5–5.1)
Potassium: 3.7 mmol/L (ref 3.5–5.1)
Sodium: 134 mmol/L — ABNORMAL LOW (ref 135–145)
Sodium: 134 mmol/L — ABNORMAL LOW (ref 135–145)
Sodium: 134 mmol/L — ABNORMAL LOW (ref 135–145)
Sodium: 135 mmol/L (ref 135–145)
Sodium: 135 mmol/L (ref 135–145)
Sodium: 135 mmol/L (ref 135–145)

## 2019-03-03 LAB — GLUCOSE, CAPILLARY
Glucose-Capillary: 123 mg/dL — ABNORMAL HIGH (ref 70–99)
Glucose-Capillary: 125 mg/dL — ABNORMAL HIGH (ref 70–99)
Glucose-Capillary: 133 mg/dL — ABNORMAL HIGH (ref 70–99)
Glucose-Capillary: 151 mg/dL — ABNORMAL HIGH (ref 70–99)
Glucose-Capillary: 157 mg/dL — ABNORMAL HIGH (ref 70–99)
Glucose-Capillary: 161 mg/dL — ABNORMAL HIGH (ref 70–99)
Glucose-Capillary: 167 mg/dL — ABNORMAL HIGH (ref 70–99)
Glucose-Capillary: 172 mg/dL — ABNORMAL HIGH (ref 70–99)
Glucose-Capillary: 182 mg/dL — ABNORMAL HIGH (ref 70–99)
Glucose-Capillary: 187 mg/dL — ABNORMAL HIGH (ref 70–99)
Glucose-Capillary: 192 mg/dL — ABNORMAL HIGH (ref 70–99)
Glucose-Capillary: 197 mg/dL — ABNORMAL HIGH (ref 70–99)
Glucose-Capillary: 207 mg/dL — ABNORMAL HIGH (ref 70–99)
Glucose-Capillary: 216 mg/dL — ABNORMAL HIGH (ref 70–99)
Glucose-Capillary: 232 mg/dL — ABNORMAL HIGH (ref 70–99)
Glucose-Capillary: 233 mg/dL — ABNORMAL HIGH (ref 70–99)
Glucose-Capillary: 264 mg/dL — ABNORMAL HIGH (ref 70–99)
Glucose-Capillary: 276 mg/dL — ABNORMAL HIGH (ref 70–99)
Glucose-Capillary: 276 mg/dL — ABNORMAL HIGH (ref 70–99)
Glucose-Capillary: 294 mg/dL — ABNORMAL HIGH (ref 70–99)
Glucose-Capillary: 323 mg/dL — ABNORMAL HIGH (ref 70–99)

## 2019-03-03 LAB — HEMOGLOBIN A1C
Hgb A1c MFr Bld: >15.5 % — ABNORMAL HIGH (ref 4.8–5.6)
Hgb A1c MFr Bld: 15.2 % — ABNORMAL HIGH (ref 4.8–5.6)
Mean Plasma Glucose: >398 mg/dL
Mean Plasma Glucose: 389.54 mg/dL

## 2019-03-03 LAB — BETA-HYDROXYBUTYRIC ACID
Beta-Hydroxybutyric Acid: 1.49 mmol/L — ABNORMAL HIGH (ref 0.05–0.27)
Beta-Hydroxybutyric Acid: 1.74 mmol/L — ABNORMAL HIGH (ref 0.05–0.27)
Beta-Hydroxybutyric Acid: 2.56 mmol/L — ABNORMAL HIGH (ref 0.05–0.27)
Beta-Hydroxybutyric Acid: 2.64 mmol/L — ABNORMAL HIGH (ref 0.05–0.27)
Beta-Hydroxybutyric Acid: 4.34 mmol/L — ABNORMAL HIGH (ref 0.05–0.27)

## 2019-03-03 MED ORDER — ACETAMINOPHEN 650 MG RE SUPP
650.0000 mg | Freq: Once | RECTAL | Status: AC
  Administered 2019-03-03: 650 mg via RECTAL
  Filled 2019-03-03: qty 1

## 2019-03-03 MED ORDER — POTASSIUM CHLORIDE 10 MEQ/100ML IV SOLN
10.0000 meq | INTRAVENOUS | Status: AC
  Administered 2019-03-03 (×2): 10 meq via INTRAVENOUS
  Filled 2019-03-03 (×2): qty 100

## 2019-03-03 MED ORDER — POTASSIUM CHLORIDE 20 MEQ PO PACK
40.0000 meq | PACK | Freq: Once | ORAL | Status: AC
  Administered 2019-03-03: 40 meq via ORAL
  Filled 2019-03-03: qty 2

## 2019-03-03 NOTE — Plan of Care (Signed)
  RD consulted for nutrition education regarding diabetes.   Lab Results  Component Value Date   HGBA1C 15.2 (H) 03/03/2019    RD provided "Carbohydrate Counting for People with Diabetes" handout from the Academy of Nutrition and Dietetics. Discussed different food groups and their effects on blood sugar, emphasizing carbohydrate-containing foods. Provided list of carbohydrates and recommended serving sizes of common foods.  Discussed importance of controlled and consistent carbohydrate intake throughout the day. Provided examples of ways to balance meals/snacks and encouraged intake of high-fiber, whole grain complex carbohydrates. Teach back method used.  Pt reports eating 1-2 meals daily due to working night shift. States she works from 9 pm to 6 am five days a week and the other two she works from 5 am-2 pm. Meals typically consist of fruit salad and a yogurt. She drinks mostly Body armour and cranberry juice. RD dicussed importance of having three balanced meals daily, my plate method, which foods contain carbohydrates, and which drinks have little effect on blood sugar. Pt seemed willing to make changes as she already started implementing changes PTA. She requests to see outpatient dietitian. RD to make referral.   Expect fair compliance.  Body mass index is 40.44 kg/m. Pt meets criteria for morbid obesity based on current BMI.  abs and medications reviewed. No further nutrition interventions warranted at this time. RD contact information provided. If additional nutrition issues arise, please re-consult RD.  Vanessa Kick RD, LDN Clinical Nutrition Pager # [phone]

## 2019-03-03 NOTE — Progress Notes (Addendum)
Inpatient Diabetes Program Recommendations  AACE/ADA: New Consensus Statement on Inpatient Glycemic Control (2015)  Target Ranges:  Prepandial:   less than 140 mg/dL      Peak postprandial:   less than 180 mg/dL (1-2 hours)      Critically ill patients:  140 - 180 mg/dL   Lab Results  Component Value Date   GLUCAP 232 (H) 03/03/2019   HGBA1C 15.2 (H) 03/03/2019    Review of Glycemic Control  Results for Jade, Mullins (MRN [id number]) as of 03/03/2019 14:38  Ref. Range 03/03/2019 11:16 03/03/2019 12:13 03/03/2019 13:23 03/03/2019 14:00  Glucose-Capillary Latest Ref Range: 70 - 99 mg/dL 197 (H) 187 (H) 125 (H) 232 (H)    Diabetes history: New Onset DM in November Outpatient Diabetes medications: Metformin 500 mg BID  Current orders for Inpatient glycemic control: IV Insulin  Inpatient Diabetes Program Recommendations:     When patient is ready to transition off of IV insulin to SQ insulin (CO2 >20 and AG 12 or less)  Please consider  -Lantus 22 units daily (0.2/kg X 110.2) 2 hours prior to transitioning off Endotool -Novolog 0-15 Q4H   Note:  Spoke with patient at bedside.  Reviewed patient's current A1c of 15.2%. Explained what a A1c is and what it measures. Also reviewed goal A1c with patient, importance of good glucose control @ home, and blood sugar goals.  Discussed long term risks of elevated CBG's.  Patient was newly diagnosed with DM2 in November at an Urgent Care and prescribed Metformin 500 BID.  Presents with CBG 533 mg/dl.  CO2 15 and AP 16.    No PCP or insurance.  TOC is working with patient to establish care at the [employer] [facility] and [employer].  Patient has a meter.  Her mom and brother are both DM2.  Her mom is on insulin.    Educated patient on insulin pen use at home. Reviewed contents of insulin flexpen starter kit. Reviewed all steps if insulin pen including attachment of needle, 2-unit air shot, dialing up dose, giving injection, removing needle, disposal of  sharps, storage of unused insulin, disposal of insulin etc. Patient able to provide successful return demonstration. Also reviewed troubleshooting with insulin pen. MD to give patient Rxs for insulin pens and insulin pen needles.  Spoke with Anderson Malta, RN and she has already been providing education on DM, insulin injections and CBG checks.  She has allowed pt to check her own BS and will start allowing pt to administer her insulin.  Reviewed Hypoglycemia and treatments.    Living Well with Diabetes Booklet at [facility];  insulin pen starter kit ordered.  Will plan on following up with patient again tomorrow.    Thank you, Reche Dixon, RN, BSN Diabetes Coordinator Inpatient Diabetes Program [phone] (team pager from 8a-5p)

## 2019-03-03 NOTE — Progress Notes (Signed)
PROGRESS NOTE    Jade Mullins  TDD:[id number]  DOB: [birth year]  PCP: Patient, No Pcp Per Admit date:03/02/2019  24 y.o. female with medical history significant of sciatica; DM; and morbid obesity (BMI 40) presenting with polyuria/hyperglycemia with blood glucose 500 to an urgent care facility and diagnosed with "prediabetes" in November-she was issued a prescription for Metformin. She followed life style modification recommendations and lost 27 pounds since that time. She ran out of Metformin a week ago, presents now with high BG levels as noted on her home glucometer checks associated with vision changes, headaches, hypersomnolence,abdominal pain, dry mouth with burning sensation on her tongue. ED Course: Afebrile, Mild DKA, initially planned for d/c after insulin and IVF - but CO2 dropped to 12.  Admitted to [facility] service with IV fluids, insulin drip  Subjective:  Patient resting comfortably, denies any complaints.  Still on insulin drip and morning labs show bicarb level at 14 and beta hydroxybutyrate level at 4.   Objective: Vitals:   03/02/19 2031 03/03/19 0359 03/03/19 0803 03/03/19 1206  BP: (!) 116/94 (!) 126/94 124/83 115/75  Pulse: 84 82 70 63  Resp: 15 (!) 23 13 16   Temp: 98.3 F (36.8 C) 98 F (36.7 C) (!) 97.5 F (36.4 C) 98.2 F (36.8 C)  TempSrc: Oral Oral Oral Oral  SpO2: 100% 100% 100% 100%  Weight:      Height:       No intake or output data in the 24 hours ending 03/03/19 1825 Filed Weights   03/02/19 0634  Weight: 110.2 kg    Physical Examination:  General exam: Appears calm and comfortable  Respiratory system: Clear to auscultation. Respiratory effort normal. Cardiovascular system: S1 & S2 heard, RRR. No JVD, murmurs, rubs, gallops or clicks. No pedal edema. Gastrointestinal system: Abdomen is nondistended, soft and nontender. Normal bowel sounds heard. Central nervous system: Alert and oriented. No new focal neurological deficits. Extremities: No  contractures, edema or joint deformities.  Skin: No rashes, lesions or ulcers Psychiatry: Judgement and insight appear normal. Mood & affect appropriate.   Data Reviewed: I have personally reviewed following labs and imaging studies  CBC: Recent Labs  Lab 03/02/19 0634 03/02/19 1322  WBC 6.9  --   HGB 13.4 12.9  HCT 42.1 38.0  MCV 84.7  --   PLT 210  --    Basic Metabolic Panel: Recent Labs  Lab 03/02/19 2006 03/03/19 0018 03/03/19 0356 03/03/19 0756 03/03/19 1453  NA 134* 134* 134* 135 135  K 3.8 3.6 3.5 3.7 3.0*  CL 109 110 109 108 106  CO2 13* 15* 12* 14* 17*  GLUCOSE 162* 151* 159* 180* 266*  BUN <5* <5* <5* <5* <5*  CREATININE 0.69 0.58 0.53 0.65 0.62  CALCIUM 8.0* 8.2* 8.2* 8.8* 8.9   GFR: Estimated Creatinine Clearance: 134 mL/min (by C-G formula based on SCr of 0.62 mg/dL). Liver Function Tests: Recent Labs  Lab 03/02/19 0634  AST 19  ALT 19  ALKPHOS 117  BILITOT 1.8*  PROT 7.3  ALBUMIN 3.6   Recent Labs  Lab 03/02/19 0634  LIPASE 25   No results for input(s): AMMONIA in the last 168 hours. Coagulation Profile: No results for input(s): INR, PROTIME in the last 168 hours. Cardiac Enzymes: No results for input(s): CKTOTAL, CKMB, CKMBINDEX, TROPONINI in the last 168 hours. BNP (last 3 results) No results for input(s): PROBNP in the last 8760 hours. HbA1C: Recent Labs    03/02/19 1622 03/03/19 1026  HGBA1C >15.5*  15.2*   CBG: Recent Labs  Lab 03/03/19 1400 03/03/19 1456 03/03/19 1602 03/03/19 1656 03/03/19 1746  GLUCAP 232* 276* 294* 323* 276*   Lipid Profile: No results for input(s): CHOL, HDL, LDLCALC, TRIG, CHOLHDL, LDLDIRECT in the last 72 hours. Thyroid Function Tests: No results for input(s): TSH, T4TOTAL, FREET4, T3FREE, THYROIDAB in the last 72 hours. Anemia Panel: No results for input(s): VITAMINB12, FOLATE, FERRITIN, TIBC, IRON, RETICCTPCT in the last 72 hours. Sepsis Labs: No results for input(s): PROCALCITON,  LATICACIDVEN in the last 168 hours.  Recent Results (from the past 240 hour(s))  Respiratory Panel by RT PCR (Flu A&B, Covid) - Nasopharyngeal Swab     Status: None   Collection Time: 03/02/19  1:58 PM   Specimen: Nasopharyngeal Swab  Result Value Ref Range Status   SARS Coronavirus 2 by RT PCR NEGATIVE NEGATIVE Final    Comment: (NOTE) SARS-CoV-2 target nucleic acids are NOT DETECTED. The SARS-CoV-2 RNA is generally detectable in upper respiratoy specimens during the acute phase of infection. The lowest concentration of SARS-CoV-2 viral copies this assay can detect is 131 copies/mL. A negative result does not preclude SARS-Cov-2 infection and should not be used as the sole basis for treatment or other patient management decisions. A negative result may occur with  improper specimen collection/handling, submission of specimen other than nasopharyngeal swab, presence of viral mutation(s) within the areas targeted by this assay, and inadequate number of viral copies (<131 copies/mL). A negative result must be combined with clinical observations, patient history, and epidemiological information. The expected result is Negative. Fact Sheet for Patients:  https://www.moore.com/ Fact Sheet for Healthcare Providers:  https://www.young.biz/ This test is not yet ap proved or cleared by the [place] FDA and  has been authorized for detection and/or diagnosis of SARS-CoV-2 by FDA under an Emergency Use Authorization (EUA). This EUA will remain  in effect (meaning this test can be used) for the duration of the COVID-19 declaration under Section 564(b)(1) of the Act, 21 U.S.C. section 360bbb-3(b)(1), unless the authorization is terminated or revoked sooner.    Influenza A by PCR NEGATIVE NEGATIVE Final   Influenza B by PCR NEGATIVE NEGATIVE Final    Comment: (NOTE) The Xpert Xpress SARS-CoV-2/FLU/RSV assay is intended as an aid in  the diagnosis of  influenza from Nasopharyngeal swab specimens and  should not be used as a sole basis for treatment. Nasal washings and  aspirates are unacceptable for Xpert Xpress SARS-CoV-2/FLU/RSV  testing. Fact Sheet for Patients: https://www.moore.com/ Fact Sheet for Healthcare Providers: https://www.young.biz/ This test is not yet approved or cleared by the [place] FDA and  has been authorized for detection and/or diagnosis of SARS-CoV-2 by  FDA under an Emergency Use Authorization (EUA). This EUA will remain  in effect (meaning this test can be used) for the duration of the  Covid-19 declaration under Section 564(b)(1) of the Act, 21  U.S.C. section 360bbb-3(b)(1), unless the authorization is  terminated or revoked. Performed at [facility], [address] [place], [place] [address]       Radiology Studies: No results found.   Scheduled Meds: . enoxaparin (LOVENOX) injection  40 mg Subcutaneous Q24H   Continuous Infusions: . sodium chloride 150 mL/hr at 03/02/19 1423  . dextrose 5 % and 0.45% NaCl 125 mL/hr at 03/03/19 1321  . insulin 11.5 Units/hr (03/03/19 1747)    Assessment & Plan:   1. DKA: continue insulin drip. Bicarb still low at 14--> improved to 17 this afternoon. Betahydroxybutyrate  still uptrending at 4.34 today.  Anion gap closed.  HgbA1C 7.3 in 2018, repeat level now at 15.  She denies having blood work done during the recent urgent care visit.  She will need insulin upon discharge given high A1c level.  Seen by diabetes educator and recommended transitioning to subcu insulin, Lantus 22 units at bedtime, once acidosis resolves.  Patient will also need education regarding daily glucose checks/glucometer use. TOC team consult for PCP appointment at [facility] and [facility]  2.  Hypokalemia: In the setting of insulin drip and IV fluids.  Replace p.o. and IV  3. Morbid obesity: Congratulated her on recent  efforts and weight loss accomplishment, encouraged to continue focusing on lifestyle modifications and improvement in A1c levels upon 59-month follow-up.  Diabetes educator assisting with diet modifications and healthy choices.  DVT prophylaxis: Lovenox Code Status: Full code Family / Patient Communication: Discussed with patient as above Disposition Plan: Home once off insulin drip and transition successfully to subcu insulin    LOS: 0 days    Time spent: 35 minutes    Guilford Shi, MD [facility] Pager [phone]  If 7PM-7AM, please contact night-coverage [email or web address] Password [facility] 03/03/2019, 6:25 PM

## 2019-03-04 LAB — BASIC METABOLIC PANEL
Anion gap: 6 (ref 5–15)
BUN: <5 mg/dL — ABNORMAL LOW (ref 6–20)
CO2: 22 mmol/L (ref 22–32)
Calcium: 8.6 mg/dL — ABNORMAL LOW (ref 8.9–10.3)
Chloride: 108 mmol/L (ref 98–111)
Creatinine, Ser: 0.58 mg/dL (ref 0.44–1.00)
GFR calc Af Amer: >60 mL/min (ref >60)
GFR calc non Af Amer: >60 mL/min (ref >60)
Glucose, Bld: 153 mg/dL — ABNORMAL HIGH (ref 70–99)
Potassium: 3.6 mmol/L (ref 3.5–5.1)
Sodium: 136 mmol/L (ref 135–145)

## 2019-03-04 LAB — GLUCOSE, CAPILLARY
Glucose-Capillary: 130 mg/dL — ABNORMAL HIGH (ref 70–99)
Glucose-Capillary: 152 mg/dL — ABNORMAL HIGH (ref 70–99)
Glucose-Capillary: 153 mg/dL — ABNORMAL HIGH (ref 70–99)
Glucose-Capillary: 157 mg/dL — ABNORMAL HIGH (ref 70–99)
Glucose-Capillary: 179 mg/dL — ABNORMAL HIGH (ref 70–99)
Glucose-Capillary: 196 mg/dL — ABNORMAL HIGH (ref 70–99)
Glucose-Capillary: 277 mg/dL — ABNORMAL HIGH (ref 70–99)
Glucose-Capillary: 335 mg/dL — ABNORMAL HIGH (ref 70–99)
Glucose-Capillary: 339 mg/dL — ABNORMAL HIGH (ref 70–99)

## 2019-03-04 MED ORDER — INSULIN GLARGINE 100 UNIT/ML ~~LOC~~ SOLN
22.0000 [IU] | Freq: Every day | SUBCUTANEOUS | Status: DC
  Administered 2019-03-04: 22 [IU] via SUBCUTANEOUS
  Filled 2019-03-04 (×2): qty 0.22

## 2019-03-04 MED ORDER — INSULIN ASPART 100 UNIT/ML ~~LOC~~ SOLN
3.0000 [IU] | Freq: Three times a day (TID) | SUBCUTANEOUS | Status: DC
  Administered 2019-03-04 – 2019-03-05 (×2): 3 [IU] via SUBCUTANEOUS

## 2019-03-04 MED ORDER — INSULIN ASPART 100 UNIT/ML ~~LOC~~ SOLN
0.0000 [IU] | Freq: Every day | SUBCUTANEOUS | Status: DC
  Administered 2019-03-04: 3 [IU] via SUBCUTANEOUS

## 2019-03-04 MED ORDER — INSULIN ASPART 100 UNIT/ML ~~LOC~~ SOLN
0.0000 [IU] | Freq: Three times a day (TID) | SUBCUTANEOUS | Status: DC
  Administered 2019-03-04 (×2): 11 [IU] via SUBCUTANEOUS
  Administered 2019-03-04: 3 [IU] via SUBCUTANEOUS
  Administered 2019-03-05: 5 [IU] via SUBCUTANEOUS
  Administered 2019-03-05 (×2): 8 [IU] via SUBCUTANEOUS

## 2019-03-04 NOTE — Progress Notes (Signed)
PROGRESS NOTE    Jade Mullins  CHY:[id number]  DOB: 09/20/1995  PCP: Patient, No Pcp Per Admit date:03/02/2019  24 y.o. female with medical history significant of sciatica; DM; and morbid obesity (BMI 40) presenting with polyuria/hyperglycemia with blood glucose 500 to an urgent care facility and diagnosed with "prediabetes" in November-she was issued a prescription for Metformin. She followed life style modification recommendations and lost 27 pounds since that time. She ran out of Metformin a week ago, presents now with high BG levels as noted on her home glucometer checks associated with vision changes, headaches, hypersomnolence,abdominal pain, dry mouth with burning sensation on her tongue. ED Course: Afebrile, Mild DKA, initially planned for d/c after insulin and IVF - but CO2 dropped to 12.  Admitted to [facility] service with IV fluids, insulin drip  Subjective:  Patient off insulin drip overnight as bicarb corrected to >20. Reports feeling woozy currently as BG elevated at 330. Bedside RN educating patient regarding insulin self administration.  Objective: Vitals:   03/03/19 2010 03/04/19 0049 03/04/19 0623 03/04/19 1111  BP: 113/86 109/89 129/76 (!) 105/59  Pulse: 83 82 82 78  Resp: 17 20 16 19   Temp: (!) 97.4 F (36.3 C) 98 F (36.7 C) 98.3 F (36.8 C) 98.1 F (36.7 C)  TempSrc: Oral Oral Oral Oral  SpO2: 100% 100% 100% 100%  Weight:      Height:       No intake or output data in the 24 hours ending 03/04/19 1157 Filed Weights   03/02/19 0634  Weight: 110.2 kg    Physical Examination:  General exam: Appears calm and comfortable  Respiratory system: Clear to auscultation. Respiratory effort normal. Cardiovascular system: S1 & S2 heard, RRR. No JVD, murmurs, rubs, gallops or clicks. No pedal edema. Gastrointestinal system: Abdomen is nondistended, soft and nontender. Normal bowel sounds heard. Central nervous system: Alert and oriented. No new focal neurological  deficits. Extremities: No contractures, edema or joint deformities.  Skin: No rashes, lesions or ulcers Psychiatry: Judgement and insight appear normal. Mood & affect appropriate.   Data Reviewed: I have personally reviewed following labs and imaging studies  CBC: Recent Labs  Lab 03/02/19 0634 03/02/19 1322  WBC 6.9  --   HGB 13.4 12.9  HCT 42.1 38.0  MCV 84.7  --   PLT 210  --    Basic Metabolic Panel: Recent Labs  Lab 03/03/19 0756 03/03/19 1453 03/03/19 1812 03/03/19 2244 03/04/19 0219  NA 135 135 134* 135 136  K 3.7 3.0* 3.2* 3.7 3.6  CL 108 106 108 107 108  CO2 14* 17* 18* 18* 22  GLUCOSE 180* 266* 258* 238* 153*  BUN <5* <5* <5* <5* <5*  CREATININE 0.65 0.62 0.67 0.67 0.58  CALCIUM 8.8* 8.9 8.4* 8.9 8.6*   GFR: Estimated Creatinine Clearance: 134 mL/min (by C-G formula based on SCr of 0.58 mg/dL). Liver Function Tests: Recent Labs  Lab 03/02/19 0634  AST 19  ALT 19  ALKPHOS 117  BILITOT 1.8*  PROT 7.3  ALBUMIN 3.6   Recent Labs  Lab 03/02/19 0634  LIPASE 25   No results for input(s): AMMONIA in the last 168 hours. Coagulation Profile: No results for input(s): INR, PROTIME in the last 168 hours. Cardiac Enzymes: No results for input(s): CKTOTAL, CKMB, CKMBINDEX, TROPONINI in the last 168 hours. BNP (last 3 results) No results for input(s): PROBNP in the last 8760 hours. HbA1C: Recent Labs    03/02/19 1622 03/03/19 1026  HGBA1C >15.5* 15.2*  CBG: Recent Labs  Lab 03/04/19 0230 03/04/19 0341 03/04/19 0446 03/04/19 0626 03/04/19 1109  GLUCAP 130* 157* 179* 152* 335*   Lipid Profile: No results for input(s): CHOL, HDL, LDLCALC, TRIG, CHOLHDL, LDLDIRECT in the last 72 hours. Thyroid Function Tests: No results for input(s): TSH, T4TOTAL, FREET4, T3FREE, THYROIDAB in the last 72 hours. Anemia Panel: No results for input(s): VITAMINB12, FOLATE, FERRITIN, TIBC, IRON, RETICCTPCT in the last 72 hours. Sepsis Labs: No results for  input(s): PROCALCITON, LATICACIDVEN in the last 168 hours.  Recent Results (from the past 240 hour(s))  Respiratory Panel by RT PCR (Flu A&B, Covid) - Nasopharyngeal Swab     Status: None   Collection Time: 03/02/19  1:58 PM   Specimen: Nasopharyngeal Swab  Result Value Ref Range Status   SARS Coronavirus 2 by RT PCR NEGATIVE NEGATIVE Final    Comment: (NOTE) SARS-CoV-2 target nucleic acids are NOT DETECTED. The SARS-CoV-2 RNA is generally detectable in upper respiratoy specimens during the acute phase of infection. The lowest concentration of SARS-CoV-2 viral copies this assay can detect is 131 copies/mL. A negative result does not preclude SARS-Cov-2 infection and should not be used as the sole basis for treatment or other patient management decisions. A negative result may occur with  improper specimen collection/handling, submission of specimen other than nasopharyngeal swab, presence of viral mutation(s) within the areas targeted by this assay, and inadequate number of viral copies (<131 copies/mL). A negative result must be combined with clinical observations, patient history, and epidemiological information. The expected result is Negative. Fact Sheet for Patients:  https://www.moore.com/ Fact Sheet for Healthcare Providers:  https://www.young.biz/ This test is not yet ap proved or cleared by the [place] FDA and  has been authorized for detection and/or diagnosis of SARS-CoV-2 by FDA under an Emergency Use Authorization (EUA). This EUA will remain  in effect (meaning this test can be used) for the duration of the COVID-19 declaration under Section 564(b)(1) of the Act, 21 U.S.C. section 360bbb-3(b)(1), unless the authorization is terminated or revoked sooner.    Influenza A by PCR NEGATIVE NEGATIVE Final   Influenza B by PCR NEGATIVE NEGATIVE Final    Comment: (NOTE) The Xpert Xpress SARS-CoV-2/FLU/RSV assay is intended as an aid  in  the diagnosis of influenza from Nasopharyngeal swab specimens and  should not be used as a sole basis for treatment. Nasal washings and  aspirates are unacceptable for Xpert Xpress SARS-CoV-2/FLU/RSV  testing. Fact Sheet for Patients: https://www.moore.com/ Fact Sheet for Healthcare Providers: https://www.young.biz/ This test is not yet approved or cleared by the [place] FDA and  has been authorized for detection and/or diagnosis of SARS-CoV-2 by  FDA under an Emergency Use Authorization (EUA). This EUA will remain  in effect (meaning this test can be used) for the duration of the  Covid-19 declaration under Section 564(b)(1) of the Act, 21  U.S.C. section 360bbb-3(b)(1), unless the authorization is  terminated or revoked. Performed at [facility], [address], [place], [place] [address]       Radiology Studies: No results found.   Scheduled Meds: . enoxaparin (LOVENOX) injection  40 mg Subcutaneous Q24H  . insulin aspart  0-15 Units Subcutaneous TID WC  . insulin aspart  0-5 Units Subcutaneous QHS  . insulin glargine  22 Units Subcutaneous Daily   Continuous Infusions: . sodium chloride 75 mL/hr at 03/04/19 0450    Assessment & Plan:   1. DKA: off insulin drip. AG closed, Betahydroxybutyrate downtrended. HgbA1C 7.3  in 2018, repeat level now at 15.  She denies having blood work done during the recent urgent care visit.  She will need insulin upon discharge given high A1c level.  Seen by diabetes educator and recommended transitioning to subcu insulin, Lantus 22 units at bedtime which she received overnight. Will add premeal coverage and monitor response. Patient will need education regarding insulin self administration, daily glucose checks/glucometer use.TOC team consulted for PCP appointment at [facility] and [facility]  2.  Hypokalemia: In the setting of insulin drip and IV fluids.  Replaced p.o. and  IV  3. Morbid obesity: Congratulated her on recent efforts and weight loss accomplishment, encouraged to continue focusing on lifestyle modifications and improvement in A1c levels upon 13-month follow-up.  Diabetes educator assisting with diet modifications and healthy choices.  DVT prophylaxis: Lovenox Code Status: Full code Family / Patient Communication: Discussed with patient as above Disposition Plan: Home once off insulin drip and transition successfully to subcu insulin    LOS: 1 day    Time spent: 25 minutes    Guilford Shi, MD [facility] Pager [phone]  If 7PM-7AM, please contact night-coverage [email or web address] Password [facility] 03/04/2019, 11:57 AM

## 2019-03-04 NOTE — Progress Notes (Signed)
Inpatient Diabetes Program Recommendations  AACE/ADA: New Consensus Statement on Inpatient Glycemic Control (2015)  Target Ranges:  Prepandial:   less than 140 mg/dL      Peak postprandial:   less than 180 mg/dL (1-2 hours)      Critically ill patients:  140 - 180 mg/dL   Lab Results  Component Value Date   GLUCAP 335 (H) 03/04/2019   HGBA1C 15.2 (H) 03/03/2019    Review of Glycemic Control  Results for HANNAN, TETZLAFF (MRN [id number]) as of 03/04/2019 13:47  Ref. Range 03/04/2019 03:41 03/04/2019 04:46 03/04/2019 06:26 03/04/2019 11:09  Glucose-Capillary Latest Ref Range: 70 - 99 mg/dL 856 (H) 314 (H) 970 (H) 335 (H)    Diabetes history: DM2 Outpatient Diabetes medications: Metformin 500 mg BID Current orders for Inpatient glycemic control: Novolog 0-15 TID + 0-5 HS Novolog 3 units TID with meals (start @ 1700) + Lantus 22 units daily   Inpatient Diabetes Program Recommendations:     Noted that patient transitioned off of IV insulin this morning.  Meal coverage to start tonight.    -Novolog 0-15 Q4H as patient has recently transitioned off of IV Insulin drip  Thank you, Dulce Sellar, RN, BSN Diabetes Coordinator Inpatient Diabetes Program [phone] (team pager from 8a-5p)

## 2019-03-05 ENCOUNTER — Other Ambulatory Visit: Payer: Self-pay

## 2019-03-05 LAB — GLUCOSE, CAPILLARY
Glucose-Capillary: 241 mg/dL — ABNORMAL HIGH (ref 70–99)
Glucose-Capillary: 287 mg/dL — ABNORMAL HIGH (ref 70–99)
Glucose-Capillary: 258 mg/dL — ABNORMAL HIGH (ref 70–99)

## 2019-03-05 MED ORDER — INSULIN ASPART 100 UNIT/ML ~~LOC~~ SOLN
5.0000 [IU] | Freq: Three times a day (TID) | SUBCUTANEOUS | Status: DC
  Administered 2019-03-05 (×2): 5 [IU] via SUBCUTANEOUS

## 2019-03-05 MED ORDER — INSULIN ASPART 100 UNIT/ML FLEXPEN: 5.0000 [IU] | PEN_INJECTOR | Freq: Three times a day (TID) | SUBCUTANEOUS | 11 refills | Status: DC

## 2019-03-05 MED ORDER — INSULIN GLARGINE 100 UNIT/ML ~~LOC~~ SOLN
30.0000 [IU] | Freq: Every day | SUBCUTANEOUS | Status: DC
  Administered 2019-03-05: 30 [IU] via SUBCUTANEOUS
  Filled 2019-03-05: qty 0.3

## 2019-03-05 MED ORDER — INSULIN GLARGINE 100 UNITS/ML SOLOSTAR PEN: 30.0000 [IU] | PEN_INJECTOR | Freq: Every day | SUBCUTANEOUS | 11 refills | Status: DC

## 2019-03-05 MED ORDER — "PEN NEEDLES 3/16"" 31G X 5 MM MISC": 1.0000 "application " | Freq: Four times a day (QID) | 3 refills | Status: DC

## 2019-03-05 NOTE — Progress Notes (Addendum)
Inpatient Diabetes Program Recommendations  AACE/ADA: New Consensus Statement on Inpatient Glycemic Control (2015)  Target Ranges:  Prepandial:   less than 140 mg/dL      Peak postprandial:   less than 180 mg/dL (1-2 hours)      Critically ill patients:  140 - 180 mg/dL   Lab Results  Component Value Date   GLUCAP 241 (H) 03/05/2019   HGBA1C 15.2 (H) 03/03/2019    Review of Glycemic Control   Results for FARA, WORTHY (MRN [id number]) as of 03/05/2019 09:22  Ref. Range 03/04/2019 06:26 03/04/2019 11:09 03/04/2019 16:23 03/04/2019 21:04 03/05/2019 06:27  Glucose-Capillary Latest Ref Range: 70 - 99 mg/dL 742 (H) 595 (H) 638 (H) 277 (H) 241 (H)   Diabetes history: New Onset DM in November Outpatient Diabetes medications: Metformin 500 mg BID Current order: Novolog 0-15 TID + 0-5 QHS + Novolog 5 units TID with meals + Lantus 22 units daily       Inpatient Diabetes Program Recommendations:      -Lantus 30 daily  -Recommend at discharge-Lantus 30 units QAM, Novolog 5 units TID with meals, sensitive SSI Novolog 0-9 TID with meals, Metformin 500 mg BID  -Insulin pen Needles order number- #[id number]  Spoke with patient at bedside.  Mom on Face time as well. Reviewed hypoglycemia at length.  Reviewed insulin regime.  Appointment scheduled for 03/26/19 at [facility].  Spkoke with TOC, Kristi.  Medications will be delivered to room prior to DC.       Thank you, Dulce Sellar, RN, BSN Diabetes Coordinator Inpatient Diabetes Program [phone] (team pager from 8a-5p)

## 2019-03-05 NOTE — TOC Transition Note (Signed)
Transition of Care [facility]) - CM/SW Discharge Note Donn Pierini RN, BSN Transitions of Care Unit 4E- RN Case Manager [phone]   Patient Details  Name: Jade Mullins MRN: [id number] Date of Birth: 1995/12/25  Transition of Care [facility]) CM/SW Contact:  Darrold Span, RN Phone Number: 03/05/2019, 5:09 PM   Clinical Narrative:    Pt stable for transition home today, f/u appointment made for PCP needs with [facility] for 2/10- first available. Pt also need assistance with medications for discharge and was provided [facility] letter to use at local [facility]. Program explained with copay cost $3 per script. Pt will f/u with [facility] pharmacy for future medication needs and assistance.    Final next level of care: Home/Self Care Barriers to Discharge: No Barriers Identified   Patient Goals and CMS Choice Patient states their goals for this hospitalization and ongoing recovery are:: return home CMS Medicare.gov Compare Post Acute Care list provided to:: Patient Choice offered to / list presented to : NA  Discharge Placement               Home        Discharge Plan and Services   Discharge Planning Services: Medication Assistance, MATCH Program, [facility], Follow-up appt scheduled Post Acute Care Choice: NA          DME Arranged: N/A DME Agency: NA       HH Arranged: NA HH Agency: NA        Social Determinants of Health (SDOH) Interventions     Readmission Risk Interventions Readmission Risk Prevention Plan 03/05/2019  Post Dischage Appt Complete  Medication Screening Complete  Transportation Screening Complete  Some recent data might be hidden

## 2019-03-05 NOTE — Progress Notes (Addendum)
PROGRESS NOTE    Jade Mullins  ZOX:[id number]  DOB: 1995/04/05  PCP: Patient, No Pcp Per Admit date:03/02/2019  24 y.o. female with medical history significant of sciatica; DM; and morbid obesity (BMI 40) presenting with polyuria/hyperglycemia with blood glucose 500 to an urgent care facility and diagnosed with "prediabetes" in November-she was issued a prescription for Metformin. She followed life style modification recommendations and lost 27 pounds since that time. She ran out of Metformin a week ago, presents now with high BG levels as noted on her home glucometer checks associated with vision changes, headaches, hypersomnolence,abdominal pain, dry mouth with burning sensation on her tongue. ED Course: Afebrile, Mild DKA, initially planned for d/c after insulin and IVF - but CO2 dropped to 12.  Admitted to [facility] service with IV fluids, insulin drip  Subjective:  BG overall better today but still up in high 200s. Seen by DE . Bedside RN educating patient regarding insulin self administration.  Objective: Vitals:   03/04/19 0623 03/04/19 1111 03/04/19 2038 03/05/19 0542  BP: 129/76 (!) 105/59 104/70 120/78  Pulse: 82 78 92 78  Resp: 16 19 19 17   Temp: 98.3 F (36.8 C) 98.1 F (36.7 C) 97.9 F (36.6 C) 98.2 F (36.8 C)  TempSrc: Oral Oral Oral Oral  SpO2: 100% 100% 99% 100%  Weight:      Height:       No intake or output data in the 24 hours ending 03/05/19 1003 Filed Weights   03/02/19 0634  Weight: 110.2 kg    Physical Examination:  General exam: Appears calm and comfortable  Respiratory system: Clear to auscultation. Respiratory effort normal. Cardiovascular system: S1 & S2 heard, RRR. No JVD, murmurs, rubs, gallops or clicks. No pedal edema. Gastrointestinal system: Abdomen is nondistended, soft and nontender. Normal bowel sounds heard. Central nervous system: Alert and oriented. No new focal neurological deficits. Extremities: No contractures, edema or joint  deformities.  Skin: No rashes, lesions or ulcers Psychiatry: Judgement and insight appear normal. Mood & affect appropriate.   Data Reviewed: I have personally reviewed following labs and imaging studies  CBC: Recent Labs  Lab 03/02/19 0634 03/02/19 1322  WBC 6.9  --   HGB 13.4 12.9  HCT 42.1 38.0  MCV 84.7  --   PLT 210  --    Basic Metabolic Panel: Recent Labs  Lab 03/03/19 0756 03/03/19 1453 03/03/19 1812 03/03/19 2244 03/04/19 0219  NA 135 135 134* 135 136  K 3.7 3.0* 3.2* 3.7 3.6  CL 108 106 108 107 108  CO2 14* 17* 18* 18* 22  GLUCOSE 180* 266* 258* 238* 153*  BUN <5* <5* <5* <5* <5*  CREATININE 0.65 0.62 0.67 0.67 0.58  CALCIUM 8.8* 8.9 8.4* 8.9 8.6*   GFR: Estimated Creatinine Clearance: 134 mL/min (by C-G formula based on SCr of 0.58 mg/dL). Liver Function Tests: Recent Labs  Lab 03/02/19 0634  AST 19  ALT 19  ALKPHOS 117  BILITOT 1.8*  PROT 7.3  ALBUMIN 3.6   Recent Labs  Lab 03/02/19 0634  LIPASE 25   No results for input(s): AMMONIA in the last 168 hours. Coagulation Profile: No results for input(s): INR, PROTIME in the last 168 hours. Cardiac Enzymes: No results for input(s): CKTOTAL, CKMB, CKMBINDEX, TROPONINI in the last 168 hours. BNP (last 3 results) No results for input(s): PROBNP in the last 8760 hours. HbA1C: Recent Labs    03/02/19 1622 03/03/19 1026  HGBA1C >15.5* 15.2*   CBG: Recent Labs  Lab 03/04/19 0626 03/04/19 1109 03/04/19 1623 03/04/19 2104 03/05/19 0627  GLUCAP 152* 335* 339* 277* 241*   Lipid Profile: No results for input(s): CHOL, HDL, LDLCALC, TRIG, CHOLHDL, LDLDIRECT in the last 72 hours. Thyroid Function Tests: No results for input(s): TSH, T4TOTAL, FREET4, T3FREE, THYROIDAB in the last 72 hours. Anemia Panel: No results for input(s): VITAMINB12, FOLATE, FERRITIN, TIBC, IRON, RETICCTPCT in the last 72 hours. Sepsis Labs: No results for input(s): PROCALCITON, LATICACIDVEN in the last 168  hours.  Recent Results (from the past 240 hour(s))  Respiratory Panel by RT PCR (Flu A&B, Covid) - Nasopharyngeal Swab     Status: None   Collection Time: 03/02/19  1:58 PM   Specimen: Nasopharyngeal Swab  Result Value Ref Range Status   SARS Coronavirus 2 by RT PCR NEGATIVE NEGATIVE Final    Comment: (NOTE) SARS-CoV-2 target nucleic acids are NOT DETECTED. The SARS-CoV-2 RNA is generally detectable in upper respiratoy specimens during the acute phase of infection. The lowest concentration of SARS-CoV-2 viral copies this assay can detect is 131 copies/mL. A negative result does not preclude SARS-Cov-2 infection and should not be used as the sole basis for treatment or other patient management decisions. A negative result may occur with  improper specimen collection/handling, submission of specimen other than nasopharyngeal swab, presence of viral mutation(s) within the areas targeted by this assay, and inadequate number of viral copies (<131 copies/mL). A negative result must be combined with clinical observations, patient history, and epidemiological information. The expected result is Negative. Fact Sheet for Patients:  https://www.moore.com/ Fact Sheet for Healthcare Providers:  https://www.young.biz/ This test is not yet ap proved or cleared by the [place] FDA and  has been authorized for detection and/or diagnosis of SARS-CoV-2 by FDA under an Emergency Use Authorization (EUA). This EUA will remain  in effect (meaning this test can be used) for the duration of the COVID-19 declaration under Section 564(b)(1) of the Act, 21 U.S.C. section 360bbb-3(b)(1), unless the authorization is terminated or revoked sooner.    Influenza A by PCR NEGATIVE NEGATIVE Final   Influenza B by PCR NEGATIVE NEGATIVE Final    Comment: (NOTE) The Xpert Xpress SARS-CoV-2/FLU/RSV assay is intended as an aid in  the diagnosis of influenza from Nasopharyngeal  swab specimens and  should not be used as a sole basis for treatment. Nasal washings and  aspirates are unacceptable for Xpert Xpress SARS-CoV-2/FLU/RSV  testing. Fact Sheet for Patients: https://www.moore.com/ Fact Sheet for Healthcare Providers: https://www.young.biz/ This test is not yet approved or cleared by the [place] FDA and  has been authorized for detection and/or diagnosis of SARS-CoV-2 by  FDA under an Emergency Use Authorization (EUA). This EUA will remain  in effect (meaning this test can be used) for the duration of the  Covid-19 declaration under Section 564(b)(1) of the Act, 21  U.S.C. section 360bbb-3(b)(1), unless the authorization is  terminated or revoked. Performed at [facility], [address], [place], [place] [address]       Radiology Studies: No results found.   Scheduled Meds: . enoxaparin (LOVENOX) injection  40 mg Subcutaneous Q24H  . insulin aspart  0-15 Units Subcutaneous TID WC  . insulin aspart  0-5 Units Subcutaneous QHS  . insulin aspart  5 Units Subcutaneous TID WC  . insulin glargine  22 Units Subcutaneous Daily   Continuous Infusions:   Assessment & Plan:   1. DKA: off insulin drip. AG closed, Betahydroxybutyrate downtrended. HgbA1C 7.3 in 2018, repeat  level now at 15.  She denies having blood work done during the recent urgent care visit.  She will need insulin upon discharge given high A1c level.  Seen by diabetes educator and recommended transitioning to subcu insulin, Lantus 22 units at bedtime which she received yesterday. Also added premeal coverage 3 units yesterday--increased to 5 units today and changed long acting to Lantus 30 units q daily along with SSI /metformin per diabetes educator recommendations. Patient getting acquainted with insulin self administration, daily glucose checks/glucometer use.Patient has been set up for PCP appointment at [facility] on 2/10. Plan to d/c later today if doing well and insulin supllies to be arranged.  2.  Hypokalemia: In the setting of insulin drip and IV fluids.  Replaced p.o. and IV  3. Morbid obesity: Congratulated her on recent efforts and weight loss accomplishment, encouraged to continue focusing on lifestyle modifications and improvement in A1c levels upon 69-month follow-up.  Diabetes educator assisting with diet modifications and healthy choices.  DVT prophylaxis: Lovenox Code Status: Full code Family / Patient Communication: Discussed with patient and DE Disposition Plan: Home likely today with home insulin regimen determined   LOS: 2 days       Guilford Shi, MD [facility] Pager [phone]  If 7PM-7AM, please contact night-coverage [email or web address] Password [email or web address] 03/05/2019, 10:03 AM

## 2019-03-05 NOTE — Progress Notes (Signed)
All discharge instructions reviewed with patient. All follow up appointments and medications discussed. IV out. Patient discharging to home.  Ginette Otto, RN

## 2019-03-05 NOTE — Discharge Summary (Signed)
Physician Discharge Summary  Jade Mullins YIF:[id number] DOB: 1995-10-14 DOA: 03/02/2019  PCP: Patient, No Pcp Per  Admit date: 03/02/2019 Discharge date: 03/05/2019 Consultations: Diabetes Educator Admitted From: home Disposition: home  Discharge Diagnoses:  Principal Problem:   DKA (diabetic ketoacidosis) ([place]) Active Problems:   Morbid obesity with BMI of 40.0-44.9, adult ([place])   Diabetes mellitus type 2 in obese [facility])   Hospital Course Summary: 24 y.o.femalewith medical history significant ofsciatica; DM; and morbid obesity (BMI 40) presenting with polyuria/hyperglycemia with blood glucose 500 to an urgent care facility and diagnosed with "prediabetes" in November-she was issued a prescription for Metformin.She followed life style modification recommendations and lost 27 pounds since that time. She ran out of Metformin a week ago, presents now with high BG levels as noted on her home glucometer checks associated with vision changes, headaches, hypersomnolence,abdominal pain, dry mouth with burning sensation on her tongue. ED Course: Afebrile, Mild DKA, initially planned for d/c after insulin and IVF - but CO2 dropped to 12.  [facility] with IV fluids, insulin drip  1. DKA: off insulin drip. AG closed, Betahydroxybutyrate downtrended. HgbA1C 7.3 in 2018, repeat level now at 15.  She denies having blood work done during the recent urgent care visit.  She will need insulin upon discharge given high A1c level.  Seen by diabetes educator and recommended transitioning to subcu insulin, Lantus 22 units at bedtime which she received yesterday. Also added premeal coverage 3 units yesterday--increased to 5 units today and changed long acting to Lantus 30 units q daily along with SSI /metformin per diabetes educator recommendations. Patient got acquainted with insulin self administration, daily glucose checks/glucometer use during her stay here. She already has supply items at bed side  (glucometer, lancets, test strips). Prescriptions for insulin, pen needles issued.Patient has been set up for PCP appointment at [facility] on 2/10. She is advised to maintain a diary of blood glucose levels for PCP to review when titrate insulin dose further as needed.  2.  Hypokalemia: In the setting of insulin drip and IV fluids.  Replaced p.o. and IV  3. Morbid obesity: Congratulated her on recent efforts and weight loss accomplishment, encouraged to continue focusing on lifestyle modifications and improvement in A1c levels upon 47-month follow-up.  Diabetes educator assisting with diet modifications and healthy choices.   Discharge Exam:  Vitals:   03/05/19 0542 03/05/19 1113  BP: 120/78 135/86  Pulse: 78 63  Resp: 17 15  Temp: 98.2 F (36.8 C) 97.7 F (36.5 C)  SpO2: 100% 100%   Vitals:   03/04/19 1111 03/04/19 2038 03/05/19 0542 03/05/19 1113  BP: (!) 105/59 104/70 120/78 135/86  Pulse: 78 92 78 63  Resp: 19 19 17 15   Temp: 98.1 F (36.7 C) 97.9 F (36.6 C) 98.2 F (36.8 C) 97.7 F (36.5 C)  TempSrc: Oral Oral Oral Oral  SpO2: 100% 99% 100% 100%  Weight:      Height:        General: Pt is alert, awake, not in acute distress Cardiovascular: RRR, S1/S2 +, no rubs, no gallops Respiratory: CTA bilaterally, no wheezing, no rhonchi Abdominal: Soft, NT, ND, bowel sounds + Extremities: no edema, no cyanosis  Discharge Condition:Stable CODE STATUS: Full code Diet recommendation: Carb modified diabetic diet Recommendations for Outpatient Follow-up:  1. Follow up with PCP: On February 10 as scheduled 2. Follow up with consultants:  3. Please obtain follow up labs including: Home blood glucose readings  Home Health services upon discharge:  Equipment/Devices upon discharge:   Discharge Instructions:  Discharge Instructions    Amb Referral to Nutrition and Diabetic E   Complete by: As directed    Call MD for:  difficulty breathing,  headache or visual disturbances   Complete by: As directed    Call MD for:  persistant dizziness or light-headedness   Complete by: As directed    Call MD for:  persistant nausea and vomiting   Complete by: As directed    Call MD for:  severe uncontrolled pain   Complete by: As directed    Diet - low sodium heart healthy   Complete by: As directed    Increase activity slowly   Complete by: As directed      Allergies as of 03/05/2019      Reactions   Naproxen    Lips swelling      Medication List    TAKE these medications   insulin aspart 100 UNIT/ML FlexPen Commonly known as: NOVOLOG Inject 5 Units into the skin 3 (three) times daily with meals. And additional coverage per sliding scale insulin   insulin glargine 100 unit/mL Sopn Commonly known as: LANTUS Inject 0.3 mLs (30 Units total) into the skin daily.   metFORMIN 500 MG tablet Commonly known as: GLUCOPHAGE Take 1 tablet (500 mg total) by mouth 2 (two) times daily with a meal.   Pen Needles 3/16" 31G X 5 MM Misc 1 application by Does not apply route 4 (four) times daily.      Follow-up Information    [facility]. Go on 03/26/2019.   Why: at 9:30am for hospital followup Contact information: [address] [place] [address] [phone]         Allergies  Allergen Reactions  . Naproxen     Lips swelling      The results of significant diagnostics from this hospitalization (including imaging, microbiology, ancillary and laboratory) are listed below for reference.    Labs: BNP (last 3 results) No results for input(s): BNP in the last 8760 hours. Basic Metabolic Panel: Recent Labs  Lab 03/03/19 0756 03/03/19 1453 03/03/19 1812 03/03/19 2244 03/04/19 0219  NA 135 135 134* 135 136  K 3.7 3.0* 3.2* 3.7 3.6  CL 108 106 108 107 108  CO2 14* 17* 18* 18* 22  GLUCOSE 180* 266* 258* 238* 153*  BUN <5* <5* <5* <5* <5*  CREATININE 0.65 0.62 0.67  0.67 0.58  CALCIUM 8.8* 8.9 8.4* 8.9 8.6*   Liver Function Tests: Recent Labs  Lab 03/02/19 0634  AST 19  ALT 19  ALKPHOS 117  BILITOT 1.8*  PROT 7.3  ALBUMIN 3.6   Recent Labs  Lab 03/02/19 0634  LIPASE 25   No results for input(s): AMMONIA in the last 168 hours. CBC: Recent Labs  Lab 03/02/19 0634 03/02/19 1322  WBC 6.9  --   HGB 13.4 12.9  HCT 42.1 38.0  MCV 84.7  --   PLT 210  --    Cardiac Enzymes: No results for input(s): CKTOTAL, CKMB, CKMBINDEX, TROPONINI in the last 168 hours. BNP: Invalid input(s): POCBNP CBG: Recent Labs  Lab 03/04/19 1109 03/04/19 1623 03/04/19 2104 03/05/19 0627 03/05/19 1110  GLUCAP 335* 339* 277* 241* 287*   D-Dimer No results for input(s): DDIMER in the last 72 hours. Hgb A1c Recent Labs    03/02/19 1622 03/03/19 1026  HGBA1C >15.5* 15.2*   Lipid Profile No  results for input(s): CHOL, HDL, LDLCALC, TRIG, CHOLHDL, LDLDIRECT in the last 72 hours. Thyroid function studies No results for input(s): TSH, T4TOTAL, T3FREE, THYROIDAB in the last 72 hours.  Invalid input(s): FREET3 Anemia work up No results for input(s): VITAMINB12, FOLATE, FERRITIN, TIBC, IRON, RETICCTPCT in the last 72 hours. Urinalysis    Component Value Date/Time   COLORURINE COLORLESS (A) 03/02/2019 0628   APPEARANCEUR CLEAR 03/02/2019 0628   LABSPEC 1.032 (H) 03/02/2019 0628   PHURINE 5.0 03/02/2019 0628   GLUCOSEU >=500 (A) 03/02/2019 0628   HGBUR NEGATIVE 03/02/2019 0628   BILIRUBINUR NEGATIVE 03/02/2019 0628   KETONESUR 80 (A) 03/02/2019 0628   PROTEINUR NEGATIVE 03/02/2019 0628   UROBILINOGEN 0.2 12/18/2018 1759   NITRITE NEGATIVE 03/02/2019 0628   LEUKOCYTESUR NEGATIVE 03/02/2019 0628   Sepsis Labs Invalid input(s): PROCALCITONIN,  WBC,  LACTICIDVEN Microbiology Recent Results (from the past 240 hour(s))  Respiratory Panel by RT PCR (Flu A&B, Covid) - Nasopharyngeal Swab     Status: None   Collection Time: 03/02/19  1:58 PM    Specimen: Nasopharyngeal Swab  Result Value Ref Range Status   SARS Coronavirus 2 by RT PCR NEGATIVE NEGATIVE Final    Comment: (NOTE) SARS-CoV-2 target nucleic acids are NOT DETECTED. The SARS-CoV-2 RNA is generally detectable in upper respiratoy specimens during the acute phase of infection. The lowest concentration of SARS-CoV-2 viral copies this assay can detect is 131 copies/mL. A negative result does not preclude SARS-Cov-2 infection and should not be used as the sole basis for treatment or other patient management decisions. A negative result may occur with  improper specimen collection/handling, submission of specimen other than nasopharyngeal swab, presence of viral mutation(s) within the areas targeted by this assay, and inadequate number of viral copies (<131 copies/mL). A negative result must be combined with clinical observations, patient history, and epidemiological information. The expected result is Negative. Fact Sheet for Patients:  https://www.moore.com/ Fact Sheet for Healthcare Providers:  https://www.young.biz/ This test is not yet ap proved or cleared by the [place] FDA and  has been authorized for detection and/or diagnosis of SARS-CoV-2 by FDA under an Emergency Use Authorization (EUA). This EUA will remain  in effect (meaning this test can be used) for the duration of the COVID-19 declaration under Section 564(b)(1) of the Act, 21 U.S.C. section 360bbb-3(b)(1), unless the authorization is terminated or revoked sooner.    Influenza A by PCR NEGATIVE NEGATIVE Final   Influenza B by PCR NEGATIVE NEGATIVE Final    Comment: (NOTE) The Xpert Xpress SARS-CoV-2/FLU/RSV assay is intended as an aid in  the diagnosis of influenza from Nasopharyngeal swab specimens and  should not be used as a sole basis for treatment. Nasal washings and  aspirates are unacceptable for Xpert Xpress SARS-CoV-2/FLU/RSV  testing. Fact Sheet  for Patients: https://www.moore.com/ Fact Sheet for Healthcare Providers: https://www.young.biz/ This test is not yet approved or cleared by the [place] FDA and  has been authorized for detection and/or diagnosis of SARS-CoV-2 by  FDA under an Emergency Use Authorization (EUA). This EUA will remain  in effect (meaning this test can be used) for the duration of the  Covid-19 declaration under Section 564(b)(1) of the Act, 21  U.S.C. section 360bbb-3(b)(1), unless the authorization is  terminated or revoked. Performed at [facility], [address], [place], [place] [address]     Procedures/Studies:  No results found.  Time coordinating discharge: Over 30 minutes  SIGNED:   Alessandra Bevels, MD  [facility] 03/05/2019, 4:20 PM Pager : [phone]

## 2019-03-06 MED FILL — NOVOLOG FLEXPEN SYRINGE: 100 | 18 days supply | Qty: 9 | Fill #0

## 2019-03-06 MED FILL — LANTUS SOLOSTAR 100 UNITS/M: 100 | 30 days supply | Qty: 9 | Fill #0

## 2019-03-06 MED FILL — PENTIPS 31G X 8 MM MISC: 31G X 8 MM | 25 days supply | Qty: 100 | Fill #0

## 2019-03-20 ENCOUNTER — Ambulatory Visit: Payer: Self-pay | Admitting: Registered"

## 2019-03-26 ENCOUNTER — Other Ambulatory Visit: Payer: Self-pay

## 2019-03-26 ENCOUNTER — Ambulatory Visit: Payer: Self-pay | Attending: Family Medicine | Admitting: Family Medicine

## 2019-03-26 ENCOUNTER — Encounter: Payer: Self-pay | Admitting: Family Medicine

## 2019-03-26 DIAGNOSIS — E876 Hypokalemia: Secondary | ICD-10-CM

## 2019-03-26 DIAGNOSIS — E119 Type 2 diabetes mellitus without complications: Secondary | ICD-10-CM

## 2019-03-26 DIAGNOSIS — Z794 Long term (current) use of insulin: Secondary | ICD-10-CM

## 2019-03-26 DIAGNOSIS — E1165 Type 2 diabetes mellitus with hyperglycemia: Secondary | ICD-10-CM

## 2019-03-26 MED ORDER — INSULIN GLARGINE 100 UNIT/ML SOLOSTAR PEN: 30.0000 [IU] | PEN_INJECTOR | Freq: Every day | SUBCUTANEOUS | 3 refills | Status: DC

## 2019-03-26 NOTE — Progress Notes (Signed)
Virtual Visit via Telephone Note  I connected with Jade Mullins  on 03/26/19 at  9:30 AM EST by telephone and verified that I am speaking with the correct person using two identifiers.   I discussed the limitations, risks, security and privacy concerns of performing an evaluation and management service by telephone and the availability of in person appointments. I also discussed with the patient that there may be a patient responsible charge related to this service. The patient expressed understanding and agreed to proceed.  Patient Location: Home Provider Location: [facility] Others participating in call: none   History of Present Illness:         24 year old female, new to the practice seen status post hospitalization from 03/02/2019 through 03/05/2019 due to diabetic ketoacidosis with active problems of morbid obesity with BMI of 40-44.9 and diabetes mellitus type 2.  Per hospital notes, patient went to an urgent care and November of last year and had blood sugar around 500 and patient reported that she was diagnosed with prediabetes and prescribed metformin and subsequently lost 27 pounds with dietary changes.  She ran out of Metformin a week prior to presenting to the emergency department along with symptoms including vision changes, headaches, hypersomnolence, abdominal pain dry mouth and burning sensation on the tongue.  Hemoglobin A1c during admission was 15.3.  Potassium of 3.0 with last BMP done during hospitalization.  She reports that she is currently taking Lantus 30 units daily, short acting insulin as well as Metformin.  She reports no current issues with increased thirst, no blurred vision and no urinary frequency.  She denies any numbness or tingling in her feet.  She feels that her blood sugars are now well controlled.  She reports that she does have an appointment here on February 17 with clinical pharmacist.  She does need new refill of her Lantus.  She is checking her blood  sugars before her evening meal and she states that the blood sugars are in the 160s.  She reports that she was told in the hospital to check her blood sugars before her evening meal.  Overall she feels well at this time.  On review of chart she does have history of sciatica but reports no current back pain.  She denies any chest pain or palpitations, no shortness of breath or cough, no headaches or dizziness, no abdominal pain-no nausea or vomiting and no urinary frequency or dysuria.   Past Medical History:  Diagnosis Date  . Diabetes mellitus type 2 in obese (HCC)   . Morbid obesity with BMI of 40.0-44.9, adult (HCC)   . Sciatica     No past surgical history on file.  Family History  Problem Relation Age of Onset  . Arthritis Mother   . Diabetes Mother   . Diabetes Brother   . Hypertension Maternal Aunt   . Diabetes Maternal Aunt   . Cancer Maternal Aunt        prostate  . Hypertension Maternal Uncle   . Diabetes Maternal Uncle   . Arthritis Maternal Grandmother   . Heart disease Maternal Grandmother   . Hypertension Maternal Grandmother   . Diabetes Maternal Grandmother     Social History   Tobacco Use  . Smoking status: Former Smoker    Packs/day: 0.00  . Smokeless tobacco: Never Used  . Tobacco comment: "like a month"  Substance Use Topics  . Alcohol use: Yes    Alcohol/week: 0.0 standard drinks    Comment: none since  11/20  . Drug use: Not Currently    Types: Marijuana    Comment: last use over 6 months ago     Allergies  Allergen Reactions  . Naproxen     Lips swelling       Observations/Objective: No vital signs or physical exam conducted as visit was done via telephone  Assessment and Plan: 1. Uncontrolled type 2 diabetes mellitus with hyperglycemia ([place]) 2. Type 2 diabetes mellitus without complication, with long-term current use of insulin ([place]) She reports improved control of her blood sugars and that she has an upcoming appointment with clinical  pharmacist on February 17.  She will have blood work done at that time including lipid panel, urine microalbumin creatinine ratio and competence metabolic panel.  Refill provided of Lantus.  She is instructed to bring her glucometer and blood sugar diary to her upcoming visit.  Continue low carbohydrate diet, continue to be adherent with medication and continue monitoring of blood sugars. - Lipid Panel; Future - Comprehensive metabolic panel; Future - Insulin Glargine (LANTUS) 100 UNIT/ML Solostar Pen; Inject 30 Units into the skin daily.  Dispense: 15 mL; Refill: 3  3. Hypokalemia Patient with hypokalemia during hospitalization partially related to her high blood sugar with subsequent insulin therapy.  She will have repeat potassium as part of comprehensive metabolic panel at her upcoming visit. - Comprehensive metabolic panel; Future  Follow Up Instructions:Return for Keep February 17 appointment with clinical pharmacist.    I discussed the assessment and treatment plan with the patient. The patient was provided an opportunity to ask questions and all were answered. The patient agreed with the plan and demonstrated an understanding of the instructions.   The patient was advised to call back or seek an in-person evaluation if the symptoms worsen or if the condition fails to improve as anticipated.  I provided 12 minutes of non-face-to-face time during this encounter.   Antony Blackbird, MD

## 2019-03-26 NOTE — Patient Instructions (Signed)
Pneumococcal Polysaccharide Vaccine (PPSV23): What You Need to Know 1. Why get vaccinated? Pneumococcal polysaccharide vaccine (PPSV23) can prevent pneumococcal disease. Pneumococcal disease refers to any illness caused by pneumococcal bacteria. These bacteria can cause many types of illnesses, including pneumonia, which is an infection of the lungs. Pneumococcal bacteria are one of the most common causes of pneumonia. Besides pneumonia, pneumococcal bacteria can also cause:  Ear infections  Sinus infections  Meningitis (infection of the tissue covering the brain and spinal cord)  Bacteremia (bloodstream infection) Anyone can get pneumococcal disease, but children under 83 years of age, people with certain medical conditions, adults 65 years or older, and cigarette smokers are at the highest risk. Most pneumococcal infections are mild. However, some can result in long-term problems, such as brain damage or hearing loss. Meningitis, bacteremia, and pneumonia caused by pneumococcal disease can be fatal. 2. PPSV23 PPSV23 protects against 23 types of bacteria that cause pneumococcal disease. PPSV23 is recommended for:  All adults 65 years or older,  Anyone 2 years or older with certain medical conditions that can lead to an increased risk for pneumococcal disease. Most people need only one dose of PPSV23. A second dose of PPSV23, and another type of pneumococcal vaccine called PCV13, are recommended for certain high-risk groups. Your health care provider can give you more information. People 65 years or older should get a dose of PPSV23 even if they have already gotten one or more doses of the vaccine before they turned 54. 3. Talk with your health care provider Tell your vaccine provider if the person getting the vaccine:  Has had an allergic reaction after a previous dose of PPSV23, or has any severe, life-threatening allergies. In some cases, your health care provider may decide to  postpone PPSV23 vaccination to a future visit. People with minor illnesses, such as a cold, may be vaccinated. People who are moderately or severely ill should usually wait until they recover before getting PPSV23. Your health care provider can give you more information. 4. Risks of a vaccine reaction  Redness or pain where the shot is given, feeling tired, fever, or muscle aches can happen after PPSV23. People sometimes faint after medical procedures, including vaccination. Tell your provider if you feel dizzy or have vision changes or ringing in the ears. As with any medicine, there is a very remote chance of a vaccine causing a severe allergic reaction, other serious injury, or death. 5. What if there is a serious problem? An allergic reaction could occur after the vaccinated person leaves the clinic. If you see signs of a severe allergic reaction (hives, swelling of the face and throat, difficulty breathing, a fast heartbeat, dizziness, or weakness), call 9-1-1 and get the person to the nearest hospital. For other signs that concern you, call your health care provider. Adverse reactions should be reported to the Vaccine Adverse Event Reporting System (VAERS). Your health care provider will usually file this report, or you can do it yourself. Visit the VAERS website at www.vaers.LAgents.no or call 917-507-6558. VAERS is only for reporting reactions, and VAERS staff do not give medical advice. 6. How can I learn more?  Ask your health care provider.  Call your local or state health department.  Contact the Centers for Disease Control and Prevention (CDC): ? Call 218-766-5037 (1-800-CDC-INFO) or ? Visit CDC's website at PicCapture.uy CDC Vaccine Information Statement PPSV23 Vaccine (12/12/2017) This information is not intended to replace advice given to you by your health care provider. Make sure you  discuss any questions you have with your health care provider. Document Revised:  05/21/2018 Document Reviewed: 09/11/2017 Elsevier Patient Education  2020 Elsevier Inc.   Madilyn Fireman Td (ttanos y difteria): lo que debe saber Td (Tetanus, Diphtheria) Vaccine: What You Need to Know 1. Por qu vacunarse? La vacuna Td puede prevenir el ttanos y la difteria. El ttanos ingresa al organismo a travs de cortes o heridas. La difteria se contagia de persona a Social worker.  El [place] (T) provoca rigidez dolorosa en los msculos. El ttanos puede causar graves problemas de [place], como no poder abrir la boca, Warehouse manager dificultad para tragar y Industrial/product designer, o la muerte.  La DIFTERIA (D) puede causar dificultad para respirar, insuficiencia cardaca, parlisis o muerte. 2. Madilyn Fireman Td La vacuna Td es solo para nios de 7 aos en adelante, adolescentes y [place].  La vacuna Td habitualmente se aplica como dosis de refuerzo cada 10aos, pero tambin puede administrarse antes si la persona sufre una [place] o herida sucia y grave. En lugar de la vacuna Td, se puede utilizar otra vacuna llamada Tdap que, adems del ttanos y la difteria, protege contra la tos [place], tambin conocida como "tos convulsa".  La vacuna Td puede aplicarse al mismo tiempo que otras vacunas. 3. Hable con el mdico Comunquese con la persona que le coloca las vacunas si la persona que la recibe:  Ha tenido una reaccin alrgica despus de [place] Dear dosis anterior de cualquier vacuna contra el ttanos o la difteria, o cualquier alergia grave, potencialmente mortal.  Alguna vez tuvo sndrome de Guillain-Barr (tambin llamado SGB).  Ha tenido dolor intenso o hinchazn despus de una dosis anterior de cualquier vacuna contra el ttanos o la difteria. En algunos casos, es posible que el mdico decida posponer la aplicacin de la vacuna Td para una visita en el futuro.  Las personas que sufren trastornos menores, como un resfro, pueden vacunarse. Las [employer] tienen enfermedades moderadas o graves generalmente deben esperar  hasta recuperarse para poder recibir la vacunaTd.  Su mdico puede darle ms informacin. 4. Riesgos de [place] reaccin a la vacuna  Despus de recibir la vacuna Td a veces se puede Surveyor, mining, enrojecimiento o Paramedic donde se aplic la inyeccin, fiebre leve, dolor de cabeza, sensacin de cansancio y nuseas, vmitos, diarrea o dolor de [place]. Las personas a veces se desmayan despus de procedimientos mdicos, incluida la vacunacin. Informe al mdico si se siente mareado, tiene cambios en la visin o zumbidos en los odos.  Al igual que con cualquier Automatic Data, existe una probabilidad muy remota de que una vacuna cause una reaccin alrgica grave, otra lesin grave o la muerte. 5. Qu pasa si se presenta un problema grave? Podra producirse una reaccin alrgica despus de que la persona vacunada abandone la clnica. Si observa signos de Runner, broadcasting/film/video grave (ronchas, hinchazn de la cara y la garganta, dificultad para respirar, latidos cardacos acelerados, mareos o debilidad), llame al 9-1-1 y lleve a la persona al hospital ms cercano.  Si se presentan otros signos que le preocupan, comunquese con su mdico.  Las reacciones adversas deben informarse al Sistema de Informe de Eventos Adversos de Administrator, arts (Vaccine Adverse Event Reporting System, VAERS). Por lo general, el mdico presenta este informe o puede hacerlo usted mismo. Visite el sitio web del VAERS en www.vaers.LAgents.no o llame al [phone]. El VAERS es solo para Biomedical engineer; su personal no proporciona asesoramiento mdico. 6. Programa Nacional de Compensacin de Daos por [employer] [place] de  Compensacin de Daos por [employer] (National Vaccine Injury Compensation Program, Runner, broadcasting/film/video) es un programa federal que fue creado para Patent examiner a las personas que puedan haber sufrido daos al recibir ciertas vacunas. Visite el sitio web del VICP en GoldCloset.com.ee o llame al 1-(207)126-1423  para obtener ms informacin acerca del programa y de cmo presentar un reclamo. Hay un lmite de tiempo para presentar un reclamo de compensacin. 7. Cmo puedo obtener ms informacin?  Pregntele a su mdico.  Comunquese con el servicio de salud de su localidad o su estado.  Comunquese con Garment/textile technologist for [employer] and Prevention, CDC (Centros para el Control y [place]): ? Llame al (856)421-9840 (1-800-CDC-INFO) o ? Visite el sitio Biomedical engineer en http://hunter.com/ Declaracin de informacin sobre la vacuna Td (02/16/2018) Esta informacin no tiene Marine scientist el consejo del mdico. Asegrese de hacerle al mdico cualquier pregunta que tenga. Document Revised: 06/11/2018 Document Reviewed: 06/11/2018 Elsevier Patient Education  [place].

## 2019-04-02 ENCOUNTER — Ambulatory Visit: Payer: Self-pay | Attending: Family Medicine | Admitting: Pharmacist

## 2019-04-02 ENCOUNTER — Other Ambulatory Visit: Payer: Self-pay

## 2019-04-02 DIAGNOSIS — Z23 Encounter for immunization: Secondary | ICD-10-CM

## 2019-04-03 NOTE — Progress Notes (Signed)
Patient presents for vaccination against tetanus and strep pneumo per orders of Dr. Jillyn Hidden. Consent given. Counseling provided. No contraindications exists. Vaccine administered without incident.

## 2019-04-10 ENCOUNTER — Other Ambulatory Visit: Payer: Self-pay

## 2019-04-10 ENCOUNTER — Ambulatory Visit: Payer: Self-pay | Attending: Family Medicine

## 2019-04-10 DIAGNOSIS — Z794 Long term (current) use of insulin: Secondary | ICD-10-CM

## 2019-04-10 DIAGNOSIS — E119 Type 2 diabetes mellitus without complications: Secondary | ICD-10-CM

## 2019-04-10 DIAGNOSIS — E1165 Type 2 diabetes mellitus with hyperglycemia: Secondary | ICD-10-CM

## 2019-04-10 DIAGNOSIS — E876 Hypokalemia: Secondary | ICD-10-CM

## 2019-04-10 MED FILL — !LANTUS SOLOSTAR 100UNITS/M: 100 | 30 days supply | Qty: 9 | Fill #0

## 2019-04-10 MED FILL — !NOVOLOG FLEXPEN SYRINGE 1: 100/ML | 20 days supply | Qty: 3 | Fill #0

## 2019-04-10 MED FILL — TRUEPLUS PEN NDL 31GX5/16: 31G X 8 MM | 25 days supply | Qty: 100 | Fill #0

## 2019-04-11 LAB — COMPREHENSIVE METABOLIC PANEL WITH GFR
ALT: 12 IU/L (ref 0–32)
AST: 16 IU/L (ref 0–40)
Albumin/Globulin Ratio: 1.2 (ref 1.2–2.2)
Albumin: 3.6 g/dL — ABNORMAL LOW (ref 3.9–5.0)
Alkaline Phosphatase: 74 IU/L (ref 39–117)
BUN/Creatinine Ratio: 15 (ref 9–23)
BUN: 9 mg/dL (ref 6–20)
Bilirubin Total: 0.2 mg/dL (ref 0.0–1.2)
CO2: 22 mmol/L (ref 20–29)
Calcium: 9.3 mg/dL (ref 8.7–10.2)
Chloride: 106 mmol/L (ref 96–106)
Creatinine, Ser: 0.59 mg/dL (ref 0.57–1.00)
GFR calc Af Amer: 148 mL/min/1.73
GFR calc non Af Amer: 129 mL/min/1.73
Globulin, Total: 3.1 g/dL (ref 1.5–4.5)
Glucose: 98 mg/dL (ref 65–99)
Potassium: 4.3 mmol/L (ref 3.5–5.2)
Sodium: 141 mmol/L (ref 134–144)
Total Protein: 6.7 g/dL (ref 6.0–8.5)

## 2019-04-11 LAB — LIPID PANEL
Chol/HDL Ratio: 2.9 ratio (ref 0.0–4.4)
Cholesterol, Total: 186 mg/dL (ref 100–199)
HDL: 65 mg/dL
LDL Chol Calc (NIH): 112 mg/dL — ABNORMAL HIGH (ref 0–99)
Triglycerides: 47 mg/dL (ref 0–149)
VLDL Cholesterol Cal: 9 mg/dL (ref 5–40)

## 2019-04-11 LAB — MICROALBUMIN / CREATININE URINE RATIO
Creatinine, Urine: 68.5 mg/dL
Microalb/Creat Ratio: <4 mg/g{creat} (ref 0–29)
Microalbumin, Urine: <3 ug/mL

## 2019-04-18 ENCOUNTER — Telehealth (INDEPENDENT_AMBULATORY_CARE_PROVIDER_SITE_OTHER): Payer: Self-pay

## 2019-04-18 NOTE — Telephone Encounter (Signed)
-----   Message from Cain Saupe, MD sent at 04/15/2019  1:43 AM EST ----- Normal urine creatinine microalbumin ratio.  Normal electrolytes and normal liver enzymes however albumin is low at 3.6 with normal being 3.9-5.0 and this can be a marker of nutritional status, need for increase dietary protein.  Lipid panel with LDL/bad cholesterol 112.  Please follow a low-fat diet and consider medication to help lower your cholesterol.

## 2019-04-18 NOTE — Telephone Encounter (Signed)
Contacted patient, she verified date of birth. She is aware of results per PCP [place]. She was advised to increase protein in her diet and follow a low fat diet. She verbalized understanding. Maryjean Morn, CMA

## 2019-05-22 ENCOUNTER — Ambulatory Visit: Payer: Self-pay | Admitting: Family Medicine

## 2019-06-10 ENCOUNTER — Ambulatory Visit: Payer: Self-pay | Admitting: Nurse Practitioner

## 2019-11-16 ENCOUNTER — Ambulatory Visit (INDEPENDENT_AMBULATORY_CARE_PROVIDER_SITE_OTHER): Payer: HRSA Program

## 2019-11-16 ENCOUNTER — Ambulatory Visit (HOSPITAL_COMMUNITY)
Admission: EM | Admit: 2019-11-16 | Discharge: 2019-11-16 | Disposition: A | Discharge status: Home / Self Care | Payer: HRSA Program | Attending: Emergency Medicine | Admitting: Emergency Medicine

## 2019-11-16 ENCOUNTER — Other Ambulatory Visit: Payer: Self-pay

## 2019-11-16 ENCOUNTER — Encounter (HOSPITAL_COMMUNITY): Payer: Self-pay | Admitting: Emergency Medicine

## 2019-11-16 DIAGNOSIS — J029 Acute pharyngitis, unspecified: Secondary | ICD-10-CM

## 2019-11-16 DIAGNOSIS — R059 Cough, unspecified: Secondary | ICD-10-CM | POA: Insufficient documentation

## 2019-11-16 DIAGNOSIS — E1169 Type 2 diabetes mellitus with other specified complication: Secondary | ICD-10-CM | POA: Insufficient documentation

## 2019-11-16 DIAGNOSIS — R52 Pain, unspecified: Secondary | ICD-10-CM | POA: Diagnosis present

## 2019-11-16 DIAGNOSIS — R519 Headache, unspecified: Secondary | ICD-10-CM | POA: Insufficient documentation

## 2019-11-16 DIAGNOSIS — B349 Viral infection, unspecified: Secondary | ICD-10-CM

## 2019-11-16 DIAGNOSIS — Z1152 Encounter for screening for COVID-19: Secondary | ICD-10-CM | POA: Diagnosis present

## 2019-11-16 DIAGNOSIS — E669 Obesity, unspecified: Secondary | ICD-10-CM | POA: Diagnosis present

## 2019-11-16 DIAGNOSIS — R5383 Other fatigue: Secondary | ICD-10-CM

## 2019-11-16 DIAGNOSIS — R079 Chest pain, unspecified: Secondary | ICD-10-CM

## 2019-11-16 LAB — CBG MONITORING, ED: Glucose-Capillary: 241 mg/dL — ABNORMAL HIGH (ref 70–99)

## 2019-11-16 MED ORDER — ALBUTEROL SULFATE HFA 108 (90 BASE) MCG/ACT IN AERS: 2.0000 | INHALATION_SPRAY | RESPIRATORY_TRACT | 0 refills | Status: DC | PRN

## 2019-11-16 MED ORDER — BENZONATATE 100 MG PO CAPS: 100.0000 mg | ORAL_CAPSULE | Freq: Three times a day (TID) | ORAL | 0 refills | Status: DC

## 2019-11-16 MED ORDER — PREDNISONE 10 MG (21) PO TBPK: ORAL_TABLET | Freq: Every day | ORAL | 0 refills | Status: AC

## 2019-11-16 MED ORDER — SPACER/AERO-HOLDING CHAMBERS DEVI: 1.0000 | 0 refills | Status: DC | PRN

## 2019-11-16 NOTE — ED Triage Notes (Signed)
Pt presents with chest congestion, sore throat, body aches, and headache xs 4 days.   OTC medications are not giving relief.

## 2019-11-16 NOTE — Discharge Instructions (Addendum)
Your blood sugar today was 241.  Your chest x-ray shows no pneumonia at this time  Your COVID test is pending.  You should self quarantine until the test result is back.    Take Tylenol as needed for fever or discomfort.  Rest and keep yourself hydrated.    Go to the emergency department if you develop acute worsening symptoms.

## 2019-11-16 NOTE — ED Provider Notes (Signed)
[facility]   [id number] 11/16/19 Arrival Time: 1006   CC: COVID symptoms  SUBJECTIVE: History from: patient.  Jade Mullins is a 24 y.o. female who presents with abrupt onset of nasal congestion, PND, shortness of breath, cough, body aches, fever, headache for the last 4 days.  Has negative history of Covid.  Has not completed Covid vaccines.  Has been taking DayQuil and NyQuil with little relief.  Reports that she is diabetic and did not get to check her blood sugar this morning, requesting that we do that here today.  There are no aggravating or alleviating factors. Denies previous symptoms in the past. Denies  sinus pain, rhinorrhea, sore throat, wheezing, chest pain, nausea, changes in bowel or bladder habits.    ROS: As per HPI.  All other pertinent ROS negative.     Past Medical History:  Diagnosis Date  . Diabetes mellitus type 2 in obese (HCC)   . Morbid obesity with BMI of 40.0-44.9, adult (HCC)   . Sciatica    Past Surgical History:  Procedure Laterality Date  . NO PAST SURGERIES     Allergies  Allergen Reactions  . Naproxen     Lips swelling   No current facility-administered medications on file prior to encounter.   Current Outpatient Medications on File Prior to Encounter  Medication Sig Dispense Refill  . insulin aspart (NOVOLOG) 100 UNIT/ML FlexPen Inject 5 Units into the skin 3 (three) times daily with meals. And additional coverage per sliding scale insulin 15 mL 11  . Insulin Glargine (LANTUS) 100 UNIT/ML Solostar Pen Inject 30 Units into the skin daily. 15 mL 3  . Insulin Pen Needle (PEN NEEDLES 3/16") 31G X 5 MM MISC 1 application by Does not apply route 4 (four) times daily. 100 each 3  . metFORMIN (GLUCOPHAGE) 500 MG tablet Take 1 tablet (500 mg total) by mouth 2 (two) times daily with a meal. 60 tablet 2   Social History   Socioeconomic History  . Marital status: Single    Spouse name: Not on file  . Number of children: Not on file  .  Years of education: Not on file  . Highest education level: Not on file  Occupational History  . Occupation: Production designer, theatre/television/film at [employer]  Tobacco Use  . Smoking status: Former Smoker    Packs/day: 0.00  . Smokeless tobacco: Never Used  . Tobacco comment: "like a month"  Substance and Sexual Activity  . Alcohol use: Yes    Alcohol/week: 0.0 standard drinks    Comment: none since 11/20  . Drug use: Not Currently    Types: Marijuana    Comment: last use over 6 months ago  . Sexual activity: Not Currently  Other Topics Concern  . Not on file  Social History Narrative  . Not on file   Social Determinants of Health   Financial Resource Strain:   . Difficulty of Paying Living Expenses: Not on file  Food Insecurity:   . Worried About Programme researcher, broadcasting/film/video in the Last Year: Not on file  . Ran Out of Food in the Last Year: Not on file  Transportation Needs:   . Lack of Transportation (Medical): Not on file  . Lack of Transportation (Non-Medical): Not on file  Physical Activity:   . Days of Exercise per Week: Not on file  . Minutes of Exercise per Session: Not on file  Stress:   . Feeling of Stress : Not on file  Social Connections:   .  Frequency of Communication with Friends and Family: Not on file  . Frequency of Social Gatherings with Friends and Family: Not on file  . Attends Religious Services: Not on file  . Active Member of Clubs or Organizations: Not on file  . Attends Banker Meetings: Not on file  . Marital Status: Not on file  Intimate Partner Violence:   . Fear of Current or Ex-Partner: Not on file  . Emotionally Abused: Not on file  . Physically Abused: Not on file  . Sexually Abused: Not on file   Family History  Problem Relation Age of Onset  . Arthritis Mother   . Diabetes Mother   . Diabetes Brother   . Hypertension Maternal Aunt   . Diabetes Maternal Aunt   . Cancer Maternal Aunt        prostate  . Hypertension Maternal Uncle   . Diabetes  Maternal Uncle   . Arthritis Maternal Grandmother   . Heart disease Maternal Grandmother   . Hypertension Maternal Grandmother   . Diabetes Maternal Grandmother     OBJECTIVE:  Vitals:   11/16/19 1031  BP: (!) 140/101  Pulse: 72  Resp: 18  Temp: 98.2 F (36.8 C)  SpO2: 99%     General appearance: alert; appears fatigued, but nontoxic; speaking in full sentences and tolerating own secretions HEENT: NCAT; Ears: EACs clear, TMs pearly gray; Eyes: PERRL.  EOM grossly intact. Sinuses: nontender; Nose: nares patent without rhinorrhea, Throat: oropharynx clear, tonsils non erythematous or enlarged, uvula midline  Neck: supple with LAD Lungs: unlabored respirations, symmetrical air entry; cough: mild; no respiratory distress; wheezing noted in bilateral lung fields, diminished lung sounds in bilateral lower lobes Heart: regular rate and rhythm.  Radial pulses 2+ symmetrical bilaterally Skin: warm and dry Psychological: alert and cooperative; normal mood and affect  LABS:  Results for orders placed or performed during the hospital encounter of 11/16/19 (from the past 24 hour(s))  POC CBG monitoring     Status: Abnormal   Collection Time: 11/16/19 11:30 AM  Result Value Ref Range   Glucose-Capillary 241 (H) 70 - 99 mg/dL     ASSESSMENT & PLAN:  1. Viral illness   2. Body aches   3. Nonintractable headache, unspecified chronicity pattern, unspecified headache type   4. Encounter for screening for COVID-19   5. Other fatigue   6. Cough   7. Diabetes mellitus type 2 in obese (HCC)    Blood sugar was 241 this morning Chest x-ray today was negative Prescribed albuterol inhaler Prescribed Tessalon Perles Prednisone taper  COVID testing ordered.  It will take between 1-2 days for test results.  Someone will contact you regarding abnormal results.    Patient should remain in quarantine until they have received Covid results.  If negative you may resume normal activities (go back  to work/school) while practicing hand hygiene, social distance, and mask wearing.  If positive, patient should remain in quarantine for 10 days from symptom onset AND greater than 72 hours after symptoms resolution (absence of fever without the use of fever-reducing medication and improvement in respiratory symptoms), whichever is longer Get plenty of rest and push fluids Use OTC zyrtec for nasal congestion, runny nose, and/or sore throat Use OTC flonase for nasal congestion and runny nose Use medications daily for symptom relief Use OTC medications like ibuprofen or tylenol as needed fever or pain Call or go to the ED if you have any new or worsening symptoms such as fever,  worsening cough, shortness of breath, chest tightness, chest pain, turning blue, changes in mental status.  Reviewed expectations re: course of current medical issues. Questions answered. Outlined signs and symptoms indicating need for more acute intervention. Patient verbalized understanding. After Visit Summary given.         Moshe Cipro, NP 11/16/19 1148

## 2019-11-17 LAB — SARS CORONAVIRUS 2 (TAT 6-24 HRS): SARS Coronavirus 2: NEGATIVE

## 2019-12-17 ENCOUNTER — Ambulatory Visit: Payer: Self-pay | Admitting: *Deleted

## 2019-12-17 MED FILL — LANTUS SOLOSTAR 100 UNITS/M: 100 | 30 days supply | Qty: 9 | Fill #0

## 2019-12-17 NOTE — Telephone Encounter (Addendum)
Calls with high blood sugar due to out of insulin and oral hypoglycemic medication for over one month. CBG yesterday 379 this morning 334 and now 371 blurred vision earlier today and frequent urination and fatigue . Has drank 6 bottles of water today already. High blood sugars on/off for over one month. Patient has attemped to pick up Insulin and oral agent from the pharmacy but had an issue. I will follow up with the pharmacy and contact the patient afterwards. Patient placed RN on hold for over 10 minutes and did not return to the call.  Verified with pharmacy she has lantus solostar for refill on file since February 2021. No Metformin on file at [facility].  Reason for Disposition . [1] Blood glucose > 300 mg/dL (99.2 mmol/L) AND [4] two or more times in a row  Answer Assessment - Initial Assessment Questions 1. BLOOD GLUCOSE: "What is your blood glucose level?"      374 this morning 2. ONSET: "When did you check the blood glucose?"     Early this am. 3. USUAL RANGE: "What is your glucose level usually?" (e.g., usual fasting morning value, usual evening value)     Low 100's 4. KETONES: "Do you check for ketones (urine or blood test strips)?" If yes, ask: "What does the test show now?"      no 5. TYPE 1 or 2:  "Do you know what type of diabetes you have?"  (e.g., Type 1, Type 2, Gestational; doesn't know)      Type 2 6. INSULIN: "Do you take insulin?" "What type of insulin(s) do you use? What is the mode of delivery? (syringe, pen; injection or pump)?"      lantas and novolog 7. DIABETES PILLS: "Do you take any pills for your diabetes?" If yes, ask: "Have you missed taking any pills recently?"     Out of metformin 8. OTHER SYMPTOMS: "Do you have any symptoms?" (e.g., fever, frequent urination, difficulty breathing, dizziness, weakness, vomiting)     Frequent urination and blurred vision this morning.  9. PREGNANCY: "Is there any chance you are pregnant?" "When was your last menstrual  period?"     no  Protocols used: DIABETES - HIGH BLOOD SUGAR-A-AH

## 2020-01-14 ENCOUNTER — Encounter: Payer: Self-pay | Admitting: Critical Care Medicine

## 2020-01-14 ENCOUNTER — Ambulatory Visit: Payer: Self-pay | Attending: Critical Care Medicine | Admitting: Critical Care Medicine

## 2020-01-14 ENCOUNTER — Other Ambulatory Visit: Payer: Self-pay | Admitting: Pharmacist

## 2020-01-14 ENCOUNTER — Ambulatory Visit (HOSPITAL_BASED_OUTPATIENT_CLINIC_OR_DEPARTMENT_OTHER): Payer: Self-pay | Admitting: Pharmacist

## 2020-01-14 ENCOUNTER — Other Ambulatory Visit: Payer: Self-pay

## 2020-01-14 VITALS — BP 114/81 | HR 85 | Temp 98.4°F | Ht 66.0 in | Wt 237.4 lb

## 2020-01-14 DIAGNOSIS — Z794 Long term (current) use of insulin: Secondary | ICD-10-CM

## 2020-01-14 DIAGNOSIS — E1169 Type 2 diabetes mellitus with other specified complication: Secondary | ICD-10-CM

## 2020-01-14 DIAGNOSIS — R350 Frequency of micturition: Secondary | ICD-10-CM

## 2020-01-14 DIAGNOSIS — Z1159 Encounter for screening for other viral diseases: Secondary | ICD-10-CM

## 2020-01-14 DIAGNOSIS — E1165 Type 2 diabetes mellitus with hyperglycemia: Secondary | ICD-10-CM

## 2020-01-14 DIAGNOSIS — E119 Type 2 diabetes mellitus without complications: Secondary | ICD-10-CM

## 2020-01-14 DIAGNOSIS — E669 Obesity, unspecified: Secondary | ICD-10-CM

## 2020-01-14 DIAGNOSIS — Z6841 Body Mass Index (BMI) 40.0 and over, adult: Secondary | ICD-10-CM

## 2020-01-14 DIAGNOSIS — Z7189 Other specified counseling: Secondary | ICD-10-CM | POA: Insufficient documentation

## 2020-01-14 LAB — POCT GLYCOSYLATED HEMOGLOBIN (HGB A1C): Hemoglobin A1C: 13.6 % — AB (ref 4.0–5.6)

## 2020-01-14 LAB — GLUCOSE, POCT (MANUAL RESULT ENTRY): POC Glucose: 319 mg/dl — AB (ref 70–99)

## 2020-01-14 MED ORDER — BASAGLAR KWIKPEN 100 UNIT/ML ~~LOC~~ SOPN: 30.0000 [IU] | PEN_INJECTOR | Freq: Every day | SUBCUTANEOUS | 2 refills | Status: DC

## 2020-01-14 MED ORDER — TRUE METRIX METER W/DEVICE KIT: PACK | 0 refills | Status: AC

## 2020-01-14 MED ORDER — PEN NEEDLES 3/16" 31G X 5 MM MISC
1.0000 | Freq: Four times a day (QID) | 3 refills | Status: DC
Start: 2020-01-14 — End: 2020-02-26

## 2020-01-14 MED ORDER — ATORVASTATIN CALCIUM 20 MG PO TABS: 20.0000 mg | ORAL_TABLET | Freq: Every day | ORAL | 3 refills | Status: DC

## 2020-01-14 MED ORDER — TRUEPLUS LANCETS 28G MISC: 1 refills | Status: DC

## 2020-01-14 MED ORDER — INSULIN ASPART 100 UNIT/ML FLEXPEN
5.0000 [IU] | PEN_INJECTOR | Freq: Three times a day (TID) | SUBCUTANEOUS | 11 refills | Status: DC
Start: 2020-01-14 — End: 2020-01-14

## 2020-01-14 MED ORDER — TRUE METRIX BLOOD GLUCOSE TEST VI STRP: ORAL_STRIP | 12 refills | Status: DC

## 2020-01-14 MED ORDER — INSULIN LISPRO (1 UNIT DIAL) 100 UNIT/ML (KWIKPEN): 5.0000 [IU] | PEN_INJECTOR | Freq: Three times a day (TID) | SUBCUTANEOUS | 2 refills | Status: DC

## 2020-01-14 MED ORDER — METFORMIN HCL 500 MG PO TABS
500.0000 mg | ORAL_TABLET | Freq: Two times a day (BID) | ORAL | 2 refills | Status: DC
Start: 2020-01-14 — End: 2020-01-29

## 2020-01-14 MED ORDER — INSULIN GLARGINE 100 UNIT/ML SOLOSTAR PEN: 30.0000 [IU] | PEN_INJECTOR | Freq: Every day | SUBCUTANEOUS | 3 refills | Status: DC

## 2020-01-14 MED FILL — ?ATORVASTATIN 20 MG TABLET: 20 | 30 days supply | Qty: 30 | Fill #0

## 2020-01-14 MED FILL — METFORMIN HCL 500 MG TABS: 500 | 30 days supply | Qty: 60 | Fill #0

## 2020-01-14 MED FILL — ?HUMALOG 100 UNITS/ML KWIKP: 100 | 15 days supply | Qty: 3 | Fill #0

## 2020-01-14 MED FILL — !TRUE METRIX BLOOD GLUCOSE: 1 days supply | Qty: 1 | Fill #0

## 2020-01-14 MED FILL — TRUEplus LANCETS 28G MISC: 50 days supply | Qty: 100 | Fill #0

## 2020-01-14 MED FILL — ?BASAGLAR 100 UNITS/ML KWPE: 100 | 30 days supply | Qty: 9 | Fill #0

## 2020-01-14 MED FILL — TRUE METRIX TEST STRIP: 25 days supply | Qty: 100 | Fill #0

## 2020-01-14 NOTE — Progress Notes (Signed)
Patient was educated on the changes regarding her diabetes management. Patient understands she will start Humalog 5 units TID w/meals, Basaglar 30 units daily, and metformin XR 500 mg BID. Discussed importance of medication adherence to prevent complications regarding uncontrolled diabetes. Educated on the use of the blood glucose meter and reviewed necessary supplies and operation of the meter. Counseled on checking BG at least 2x/day - mornings fasting and 1-2 hours after largest meal. Counseled on s/sx of and management of hypoglycemia. Patient verbalized understanding. All questions and concerns were addressed.  Plan: Follow-up visit scheduled in 2 weeks with pharmacy

## 2020-01-14 NOTE — Progress Notes (Signed)
FATIGUE OUT OF ALL MEDS AND INSULIN UNABLE TO AFFORD

## 2020-01-14 NOTE — Assessment & Plan Note (Signed)
Type 2 diabetes poorly controlled off medications for 6 weeks  Resume Basaglar insulin 30 units daily and Humalog at 5 units 3 times daily with meals  Begin Lipitor 20 mg daily  Patient return in short-term follow-up 2 weeks with clinical pharmacist come back to see Dr. Delford Field in 3 weeks

## 2020-01-14 NOTE — Progress Notes (Signed)
Subjective:    Patient ID: Jade Mullins, female    DOB: 25-Apr-1995, 24 y.o.   MRN: [id number]  24 y.o.F here to est pcp Had phone visit 03/2019 with Dr Chapman Fitch , no f/u since  At 03/2019 OV documentation as below   24 year old female, new to the practice seen status post hospitalization from 03/02/2019 through 03/05/2019 due to diabetic ketoacidosis with active problems of morbid obesity with BMI of 40-44.9 and diabetes mellitus type 2.  Per hospital notes, patient went to an urgent care and November of last year and had blood sugar around 500 and patient reported that she was diagnosed with prediabetes and prescribed metformin and subsequently lost 27 pounds with dietary changes.  She ran out of Metformin a week prior to presenting to the emergency department along with symptoms including vision changes, headaches, hypersomnolence, abdominal pain dry mouth and burning sensation on the tongue.  Hemoglobin A1c during admission was 15.3.  Potassium of 3.0 with last BMP done during hospitalization.  She reports that she is currently taking Lantus 30 units daily, short acting insulin as well as Metformin.  She reports no current issues with increased thirst, no blurred vision and no urinary frequency.  She denies any numbness or tingling in her feet.  She feels that her blood sugars are now well controlled.  She reports that she does have an appointment here on February 17 with clinical pharmacist.  She does need new refill of her Lantus.  She is checking her blood sugars before her evening meal and she states that the blood sugars are in the 160s.  She reports that she was told in the hospital to check her blood sugars before her evening meal.  Overall she feels well at this time.  On review of chart she does have history of sciatica but reports no current back pain.  She denies any chest pain or palpitations, no shortness of breath or cough, no headaches or dizziness, no abdominal pain-no nausea or vomiting and  no urinary frequency or dysuria.  Marland Kitchen Uncontrolled type 2 diabetes mellitus with hyperglycemia ([place]) 2. Type 2 diabetes mellitus without complication, with long-term current use of insulin ([place]) She reports improved control of her blood sugars and that she has an upcoming appointment with clinical pharmacist on February 17.  She will have blood work done at that time including lipid panel, urine microalbumin creatinine ratio and competence metabolic panel.  Refill provided of Lantus.  She is instructed to bring her glucometer and blood sugar diary to her upcoming visit.  Continue low carbohydrate diet, continue to be adherent with medication and continue monitoring of blood sugars. - Lipid Panel; Future - Comprehensive metabolic panel; Future - Insulin Glargine (LANTUS) 100 UNIT/ML Solostar Pen; Inject 30 Units into the skin daily.  Dispense: 15 mL; Refill: 3  3. Hypokalemia Patient with hypokalemia during hospitalization partially related to her high blood sugar with subsequent insulin therapy.  She will have repeat potassium as part of comprehensive metabolic panel at her upcoming visit. - Comprehensive metabolic panel; Future  The patient subsequently saw our clinical pharmacist later in February 2021 but has not been back since.  She struggled to get return visits with her primary care provider.  She then ran out of her insulin and all of her medications about 6 weeks ago.  On arrival blood sugar is 319 A1c is 13.  Patient does have a longstanding history of uncontrolled type 2 diabetes and had been admitted with DKA earlier  this year.  She is usually on insulin glargine 30 units daily and NovoLog 5 units 3 times daily  The patient states she has polyuria polydipsia and polyphagia, patient notes severe fatigue and weight loss as well.  Saw Luke 1X no f/u since    Past Medical History:  Diagnosis Date  . Diabetes mellitus type 2 in obese ([place])   . Morbid obesity with BMI of 40.0-44.9,  adult ([place])   . Sciatica      Family History  Problem Relation Age of Onset  . Arthritis Mother   . Diabetes Mother   . Diabetes Brother   . Hypertension Maternal Aunt   . Diabetes Maternal Aunt   . Cancer Maternal Aunt        prostate  . Hypertension Maternal Uncle   . Diabetes Maternal Uncle   . Arthritis Maternal Grandmother   . Heart disease Maternal Grandmother   . Hypertension Maternal Grandmother   . Diabetes Maternal Grandmother      Social History   Socioeconomic History  . Marital status: Single    Spouse name: Not on file  . Number of children: Not on file  . Years of education: Not on file  . Highest education level: Not on file  Occupational History  . Occupation: Freight forwarder at [employer]  Tobacco Use  . Smoking status: Former Smoker    Packs/day: 0.00  . Smokeless tobacco: Never Used  . Tobacco comment: "like a month"  Substance and Sexual Activity  . Alcohol use: Yes    Alcohol/week: 0.0 standard drinks    Comment: none since 11/20  . Drug use: Not Currently    Types: Marijuana    Comment: last use over 6 months ago  . Sexual activity: Not Currently  Other Topics Concern  . Not on file  Social History Narrative  . Not on file   Social Determinants of Health   Financial Resource Strain:   . Difficulty of Paying Living Expenses: Not on file  Food Insecurity:   . Worried About Charity fundraiser in the Last Year: Not on file  . Ran Out of Food in the Last Year: Not on file  Transportation Needs:   . Lack of Transportation (Medical): Not on file  . Lack of Transportation (Non-Medical): Not on file  Physical Activity:   . Days of Exercise per Week: Not on file  . Minutes of Exercise per Session: Not on file  Stress:   . Feeling of Stress : Not on file  Social Connections:   . Frequency of Communication with Friends and Family: Not on file  . Frequency of Social Gatherings with Friends and Family: Not on file  . Attends Religious Services:  Not on file  . Active Member of Clubs or Organizations: Not on file  . Attends Archivist Meetings: Not on file  . Marital Status: Not on file  Intimate Partner Violence:   . Fear of Current or Ex-Partner: Not on file  . Emotionally Abused: Not on file  . Physically Abused: Not on file  . Sexually Abused: Not on file     Allergies  Allergen Reactions  . Naproxen     Lips swelling     Outpatient Medications Prior to Visit  Medication Sig Dispense Refill  . albuterol (VENTOLIN HFA) 108 (90 Base) MCG/ACT inhaler Inhale 2 puffs into the lungs every 4 (four) hours as needed for wheezing or shortness of breath. 18 g 0  .  insulin aspart (NOVOLOG) 100 UNIT/ML FlexPen Inject 5 Units into the skin 3 (three) times daily with meals. And additional coverage per sliding scale insulin 15 mL 11  . Insulin Glargine (LANTUS) 100 UNIT/ML Solostar Pen Inject 30 Units into the skin daily. 15 mL 3  . Insulin Pen Needle (PEN NEEDLES 3/16") 31G X 5 MM MISC 1 application by Does not apply route 4 (four) times daily. 100 each 3  . metFORMIN (GLUCOPHAGE) 500 MG tablet Take 1 tablet (500 mg total) by mouth 2 (two) times daily with a meal. 60 tablet 2  . Spacer/Aero-Holding Chambers DEVI 1 each by Does not apply route every 4 (four) hours as needed (with inhaler). 1 each 0  . benzonatate (TESSALON) 100 MG capsule Take 1 capsule (100 mg total) by mouth every 8 (eight) hours. (Patient not taking: Reported on 01/14/2020) 21 capsule 0   No facility-administered medications prior to visit.      Review of Systems  Constitutional: Positive for fatigue and unexpected weight change.  HENT: Negative.  Negative for dental problem.   Eyes: Positive for visual disturbance.  Respiratory: Positive for shortness of breath. Negative for cough.   Cardiovascular: Negative for chest pain, palpitations and leg swelling.  Gastrointestinal: Positive for abdominal pain and nausea. Negative for diarrhea and vomiting.   Endocrine: Positive for polydipsia, polyphagia and polyuria. Negative for cold intolerance and heat intolerance.  Genitourinary: Negative.   Musculoskeletal: Negative.   Skin: Negative.   Allergic/Immunologic: Negative.   Neurological: Positive for headaches. Negative for dizziness, tremors, seizures, syncope, speech difficulty, weakness, light-headedness and numbness.  Hematological: Negative.   Psychiatric/Behavioral: Negative.        Objective:   Physical Exam Vitals:   01/14/20 1134  BP: 114/81  Pulse: 85  Temp: 98.4 F (36.9 C)  SpO2: 97%  Weight: 237 lb 6.4 oz (107.7 kg)  Height: 5' 6"  (1.676 m)    Gen: Pleasant, obese, in no distress,  normal affect  ENT: No lesions,  mouth clear,  oropharynx clear, no postnasal drip  Neck: No JVD, no TMG, no carotid bruits  Lungs: No use of accessory muscles, no dullness to percussion, clear without rales or rhonchi  Cardiovascular: RRR, heart sounds normal, no murmur or gallops, no peripheral edema  Abdomen: soft and NT, no HSM,  BS normal  Musculoskeletal: No deformities, no cyanosis or clubbing  Neuro: alert, non focal  Skin: Warm, no lesions or rashes  Foot exam showed calluses on the heels dried feet but no other abnormalities     Assessment & Plan:  I personally reviewed all images and lab data in the [facility] system as well as any outside material available during this office visit and agree with the  radiology impressions.   Diabetes mellitus type 2 in obese (H[facility]) Type 2 diabetes poorly controlled off medications for 6 weeks  Resume Basaglar insulin 30 units daily and Humalog at 5 units 3 times daily with meals  Begin Lipitor 20 mg daily  Patient return in short-term follow-up 2 weeks with clinical pharmacist come back to see Dr. Joya Gaskins in 3 weeks  Morbid obesity with BMI of 40.0-44.9, adult ([place]) Morbid obesity with BMI of 38  Dietary advice given and meal plan given for diabetes   Alora was seen today  for abnormal glucose.  Diagnoses and all orders for this visit:  Uncontrolled type 2 diabetes mellitus with hyperglycemia ([place]) -     Comprehensive metabolic panel -     CBC with  Differential/Platelet -     Lipid panel -     Microalbumin / creatinine urine ratio -     Discontinue: insulin glargine (LANTUS) 100 UNIT/ML Solostar Pen; Inject 30 Units into the skin daily.  Type 2 diabetes mellitus without complication, with long-term current use of insulin ([facility]) -     POCT glucose (manual entry) -     POCT glycosylated hemoglobin (Hb A1C) -     Discontinue: insulin glargine (LANTUS) 100 UNIT/ML Solostar Pen; Inject 30 Units into the skin daily.  Urinary frequency -     Cancel: POCT URINALYSIS DIP (CLINITEK)  Need for hepatitis C screening test -     HCV Ab w/Rflx to Verification  Diabetes mellitus type 2 in obese ([place])  Morbid obesity with BMI of 40.0-44.9, adult ([place])  Other orders -     metFORMIN (GLUCOPHAGE) 500 MG tablet; Take 1 tablet (500 mg total) by mouth 2 (two) times daily with a meal. -     Discontinue: insulin aspart (NOVOLOG) 100 UNIT/ML FlexPen; Inject 5 Units into the skin 3 (three) times daily with meals. And additional coverage per sliding scale insulin -     Insulin Pen Needle (PEN NEEDLES 3/16") 31G X 5 MM MISC; 1 application by Does not apply route 4 (four) times daily. -     Blood Glucose Monitoring Suppl (TRUE METRIX METER) w/Device KIT; Use to measure blood sugar twice a day -     TRUEplus Lancets 28G MISC; Use to measure blood sugar twice a day -     glucose blood (TRUE METRIX BLOOD GLUCOSE TEST) test strip; Use as instructed -     atorvastatin (LIPITOR) 20 MG tablet; Take 1 tablet (20 mg total) by mouth daily.   Patient did receive her flu vaccine this visit

## 2020-01-14 NOTE — Patient Instructions (Addendum)
Begin insulin basaglar 30 units daily  Begin humolog insulin  5 units 3 times a day with each meal  Begin Metformin twice daily with meals  Begin atorvastatin 1 daily for cholesterol  A glucose meter and testing supplies will be issued  Return to see Franky Macho our clinical pharmacist in 2 weeks and see Dr. Delford Field in 3 weeks  Follow diabetic diet as outlined below  Labs today to check your metabolic panel blood counts hepatitis C levels lipid panel  You need a Pap smear this will be scheduled with one of our colleagues here in the clinic at a later date   Diabetes Mellitus and Foot Care Foot care is an important part of your health, especially when you have diabetes. Diabetes may cause you to have problems because of poor blood flow (circulation) to your feet and legs, which can cause your skin to:  Become thinner and drier.  Break more easily.  Heal more slowly.  Peel and crack. You may also have nerve damage (neuropathy) in your legs and feet, causing decreased feeling in them. This means that you may not notice minor injuries to your feet that could lead to more serious problems. Noticing and addressing any potential problems early is the best way to prevent future foot problems. How to care for your feet Foot hygiene  Wash your feet daily with warm water and mild soap. Do not use hot water. Then, pat your feet and the areas between your toes until they are completely dry. Do not soak your feet as this can dry your skin.  Trim your toenails straight across. Do not dig under them or around the cuticle. File the edges of your nails with an emery board or nail file.  Apply a moisturizing lotion or petroleum jelly to the skin on your feet and to dry, brittle toenails. Use lotion that does not contain alcohol and is unscented. Do not apply lotion between your toes. Shoes and socks  Wear clean socks or stockings every day. Make sure they are not too tight. Do not wear knee-high  stockings since they may decrease blood flow to your legs.  Wear shoes that fit properly and have enough cushioning. Always look in your shoes before you put them on to be sure there are no objects inside.  To break in new shoes, wear them for just a few hours a day. This prevents injuries on your feet. Wounds, scrapes, corns, and calluses  Check your feet daily for blisters, cuts, bruises, sores, and redness. If you cannot see the bottom of your feet, use a mirror or ask someone for help.  Do not cut corns or calluses or try to remove them with medicine.  If you find a minor scrape, cut, or break in the skin on your feet, keep it and the skin around it clean and dry. You may clean these areas with mild soap and water. Do not clean the area with peroxide, alcohol, or iodine.  If you have a wound, scrape, corn, or callus on your foot, look at it several times a day to make sure it is healing and not infected. Check for: ? Redness, swelling, or pain. ? Fluid or blood. ? Warmth. ? Pus or a bad smell. General instructions  Do not cross your legs. This may decrease blood flow to your feet.  Do not use heating pads or hot water bottles on your feet. They may burn your skin. If you have lost feeling in your  feet or legs, you may not know this is happening until it is too late.  Protect your feet from hot and cold by wearing shoes, such as at the beach or on hot pavement.  Schedule a complete foot exam at least once a year (annually) or more often if you have foot problems. If you have foot problems, report any cuts, sores, or bruises to your health care provider immediately. Contact a health care provider if:  You have a medical condition that increases your risk of infection and you have any cuts, sores, or bruises on your feet.  You have an injury that is not healing.  You have redness on your legs or feet.  You feel burning or tingling in your legs or feet.  You have pain or cramps  in your legs and feet.  Your legs or feet are numb.  Your feet always feel cold.  You have pain around a toenail. Get help right away if:  You have a wound, scrape, corn, or callus on your foot and: ? You have pain, swelling, or redness that gets worse. ? You have fluid or blood coming from the wound, scrape, corn, or callus. ? Your wound, scrape, corn, or callus feels warm to the touch. ? You have pus or a bad smell coming from the wound, scrape, corn, or callus. ? You have a fever. ? You have a red line going up your leg. Summary  Check your feet every day for cuts, sores, red spots, swelling, and blisters.  Moisturize feet and legs daily.  Wear shoes that fit properly and have enough cushioning.  If you have foot problems, report any cuts, sores, or bruises to your health care provider immediately.  Schedule a complete foot exam at least once a year (annually) or more often if you have foot problems. This information is not intended to replace advice given to you by your health care provider. Make sure you discuss any questions you have with your health care provider. Document Revised: 10/23/2018 Document Reviewed: 03/03/2016 Elsevier Patient Education  2020 [employer]  Diabetes Mellitus and Nutrition, Adult When you have diabetes (diabetes mellitus), it is very important to have healthy eating habits because your blood sugar (glucose) levels are greatly affected by what you eat and drink. Eating healthy foods in the appropriate amounts, at about the same times every day, can help you:  Control your blood glucose.  Lower your risk of heart disease.  Improve your blood pressure.  Reach or maintain a healthy weight. Every person with diabetes is different, and each person has different needs for a meal plan. Your health care provider may recommend that you work with a diet and nutrition specialist (dietitian) to make a meal plan that is best for you. Your meal plan may  vary depending on factors such as:  The calories you need.  The medicines you take.  Your weight.  Your blood glucose, blood pressure, and cholesterol levels.  Your activity level.  Other health conditions you have, such as heart or kidney disease. How do carbohydrates affect me? Carbohydrates, also called carbs, affect your blood glucose level more than any other type of food. Eating carbs naturally raises the amount of glucose in your blood. Carb counting is a method for keeping track of how many carbs you eat. Counting carbs is important to keep your blood glucose at a healthy level, especially if you use insulin or take certain oral diabetes medicines. It is important to  know how many carbs you can safely have in each meal. This is different for every person. Your dietitian can help you calculate how many carbs you should have at each meal and for each snack. Foods that contain carbs include:  Bread, cereal, rice, pasta, and crackers.  Potatoes and corn.  Peas, beans, and lentils.  Milk and yogurt.  Fruit and juice.  Desserts, such as cakes, cookies, ice cream, and candy. How does alcohol affect me? Alcohol can cause a sudden decrease in blood glucose (hypoglycemia), especially if you use insulin or take certain oral diabetes medicines. Hypoglycemia can be a life-threatening condition. Symptoms of hypoglycemia (sleepiness, dizziness, and confusion) are similar to symptoms of having too much alcohol. If your health care provider says that alcohol is safe for you, follow these guidelines:  Limit alcohol intake to no more than 1 drink per day for nonpregnant women and 2 drinks per day for men. One drink equals 12 oz of beer, 5 oz of wine, or 1 oz of hard liquor.  Do not drink on an empty stomach.  Keep yourself hydrated with water, diet soda, or unsweetened iced tea.  Keep in mind that regular soda, juice, and other mixers may contain a lot of sugar and must be counted as  carbs. What are tips for following this plan?  Reading food labels  Start by checking the serving size on the "Nutrition Facts" label of packaged foods and drinks. The amount of calories, carbs, fats, and other nutrients listed on the label is based on one serving of the item. Many items contain more than one serving per package.  Check the total grams (g) of carbs in one serving. You can calculate the number of servings of carbs in one serving by dividing the total carbs by 15. For example, if a food has 30 g of total carbs, it would be equal to 2 servings of carbs.  Check the number of grams (g) of saturated and trans fats in one serving. Choose foods that have low or no amount of these fats.  Check the number of milligrams (mg) of salt (sodium) in one serving. Most people should limit total sodium intake to less than 2,300 mg per day.  Always check the nutrition information of foods labeled as "low-fat" or "nonfat". These foods may be higher in added sugar or refined carbs and should be avoided.  Talk to your dietitian to identify your daily goals for nutrients listed on the label. Shopping  Avoid buying canned, premade, or processed foods. These foods tend to be high in fat, sodium, and added sugar.  Shop around the outside edge of the grocery store. This includes fresh fruits and vegetables, bulk grains, fresh meats, and fresh dairy. Cooking  Use low-heat cooking methods, such as baking, instead of high-heat cooking methods like deep frying.  Cook using healthy oils, such as olive, canola, or sunflower oil.  Avoid cooking with butter, cream, or high-fat meats. Meal planning  Eat meals and snacks regularly, preferably at the same times every day. Avoid going long periods of time without eating.  Eat foods high in fiber, such as fresh fruits, vegetables, beans, and whole grains. Talk to your dietitian about how many servings of carbs you can eat at each meal.  Eat 4-6 ounces (oz)  of lean protein each day, such as lean meat, chicken, fish, eggs, or tofu. One oz of lean protein is equal to: ? 1 oz of meat, chicken, or fish. ? 1  egg. ?  cup of tofu.  Eat some foods each day that contain healthy fats, such as avocado, nuts, seeds, and fish. Lifestyle  Check your blood glucose regularly.  Exercise regularly as told by your health care provider. This may include: ? 150 minutes of moderate-intensity or vigorous-intensity exercise each week. This could be brisk walking, biking, or water aerobics. ? Stretching and doing strength exercises, such as yoga or weightlifting, at least 2 times a week.  Take medicines as told by your health care provider.  Do not use any products that contain nicotine or tobacco, such as cigarettes and e-cigarettes. If you need help quitting, ask your health care provider.  Work with a Veterinary surgeon or diabetes educator to identify strategies to manage stress and any emotional and social challenges. Questions to ask a health care provider  Do I need to meet with a diabetes educator?  Do I need to meet with a dietitian?  What number can I call if I have questions?  When are the best times to check my blood glucose? Where to find more information:  American Diabetes Association: diabetes.org  Academy of Nutrition and Dietetics: www.eatright.[employer] of Diabetes and Digestive and Kidney Diseases (NIH): CarFli[employer].tn Summary  A healthy meal plan will help you control your blood glucose and maintain a healthy lifestyle.  Working with a diet and nutrition specialist (dietitian) can help you make a meal plan that is best for you.  Keep in mind that carbohydrates (carbs) and alcohol have immediate effects on your blood glucose levels. It is important to count carbs and to use alcohol carefully. This information is not intended to replace advice given to you by your health care provider. Make sure you discuss any questions you  have with your health care provider. Document Revised: 01/12/2017 Document Reviewed: 03/06/2016 Elsevier Patient Education  2020 [employer].

## 2020-01-14 NOTE — Assessment & Plan Note (Signed)
Morbid obesity with BMI of 38  Dietary advice given and meal plan given for diabetes

## 2020-01-15 LAB — CBC WITH DIFFERENTIAL/PLATELET
Basophils Absolute: 0 10*3/uL (ref 0.0–0.2)
Basos: 0 %
EOS (ABSOLUTE): 0.1 10*3/uL (ref 0.0–0.4)
Eos: 3 %
Hematocrit: 42.2 % (ref 34.0–46.6)
Hemoglobin: 13.9 g/dL (ref 11.1–15.9)
Immature Grans (Abs): 0 10*3/uL (ref 0.0–0.1)
Immature Granulocytes: 0 %
Lymphocytes Absolute: 1.9 10*3/uL (ref 0.7–3.1)
Lymphs: 36 %
MCH: 26.9 pg (ref 26.6–33.0)
MCHC: 32.9 g/dL (ref 31.5–35.7)
MCV: 82 fL (ref 79–97)
Monocytes Absolute: 0.6 10*3/uL (ref 0.1–0.9)
Monocytes: 12 %
Neutrophils Absolute: 2.5 10*3/uL (ref 1.4–7.0)
Neutrophils: 49 %
Platelets: 263 10*3/uL (ref 150–450)
RBC: 5.17 x10E6/uL (ref 3.77–5.28)
RDW: 15.8 % — ABNORMAL HIGH (ref 11.7–15.4)
WBC: 5.2 10*3/uL (ref 3.4–10.8)

## 2020-01-15 LAB — COMPREHENSIVE METABOLIC PANEL
ALT: 14 IU/L (ref 0–32)
AST: 12 IU/L (ref 0–40)
Albumin/Globulin Ratio: 1.1 — ABNORMAL LOW (ref 1.2–2.2)
Albumin: 3.9 g/dL (ref 3.9–5.0)
Alkaline Phosphatase: 99 IU/L (ref 44–121)
BUN/Creatinine Ratio: 12 (ref 9–23)
BUN: 8 mg/dL (ref 6–20)
Bilirubin Total: 0.3 mg/dL (ref 0.0–1.2)
CO2: 22 mmol/L (ref 20–29)
Calcium: 9.6 mg/dL (ref 8.7–10.2)
Chloride: 98 mmol/L (ref 96–106)
Creatinine, Ser: 0.69 mg/dL (ref 0.57–1.00)
GFR calc Af Amer: 140 mL/min/{1.73_m2} (ref >59)
GFR calc non Af Amer: 121 mL/min/{1.73_m2} (ref >59)
Globulin, Total: 3.5 g/dL (ref 1.5–4.5)
Glucose: 344 mg/dL — ABNORMAL HIGH (ref 65–99)
Potassium: 4.7 mmol/L (ref 3.5–5.2)
Sodium: 137 mmol/L (ref 134–144)
Total Protein: 7.4 g/dL (ref 6.0–8.5)

## 2020-01-15 LAB — LIPID PANEL
Chol/HDL Ratio: 4.1 ratio (ref 0.0–4.4)
Cholesterol, Total: 260 mg/dL — ABNORMAL HIGH (ref 100–199)
HDL: 64 mg/dL (ref >39)
LDL Chol Calc (NIH): 177 mg/dL — ABNORMAL HIGH (ref 0–99)
Triglycerides: 110 mg/dL (ref 0–149)
VLDL Cholesterol Cal: 19 mg/dL (ref 5–40)

## 2020-01-15 LAB — HCV AB W/RFLX TO VERIFICATION: HCV Ab: <0.1 s/co ratio (ref 0.0–0.9)

## 2020-01-15 LAB — HCV INTERPRETATION

## 2020-01-29 ENCOUNTER — Encounter: Payer: Self-pay | Admitting: Pharmacist

## 2020-01-29 ENCOUNTER — Other Ambulatory Visit: Payer: Self-pay

## 2020-01-29 ENCOUNTER — Ambulatory Visit: Payer: Self-pay | Attending: Family Medicine | Admitting: Pharmacist

## 2020-01-29 ENCOUNTER — Other Ambulatory Visit: Payer: Self-pay | Admitting: Critical Care Medicine

## 2020-01-29 DIAGNOSIS — E1165 Type 2 diabetes mellitus with hyperglycemia: Secondary | ICD-10-CM

## 2020-01-29 MED ORDER — INSULIN LISPRO (1 UNIT DIAL) 100 UNIT/ML (KWIKPEN): 5.0000 [IU] | PEN_INJECTOR | Freq: Three times a day (TID) | SUBCUTANEOUS | 2 refills | Status: DC

## 2020-01-29 MED ORDER — METFORMIN HCL 500 MG PO TABS
1000.0000 mg | ORAL_TABLET | Freq: Two times a day (BID) | ORAL | 2 refills | Status: DC
Start: 2020-01-29 — End: 2020-02-26

## 2020-01-29 MED ORDER — TRULICITY 0.75 MG/0.5ML ~~LOC~~ SOAJ: 0.7500 mg | SUBCUTANEOUS | 0 refills | Status: DC

## 2020-01-29 MED FILL — ?HUMALOG 100 UNITS/ML KWIKP: 100 | 20 days supply | Qty: 3 | Fill #0

## 2020-01-29 MED FILL — !TRULICITY 0.75 MG/0.5 ML P: 0.75 | 28 days supply | Qty: 2 | Fill #0

## 2020-01-29 MED FILL — TRUE METRIX GLUCOSE TEST ST: 25 days supply | Qty: 100 | Fill #1

## 2020-01-29 NOTE — Patient Instructions (Signed)
Thank you for coming to see me today. Please do the following:  1. Continue Basaglar 30 units daily.  2. Stop Humalog.  3. Increase metformin to 2 tablets in the morning and 2 at night.  4. Start Trulicity injections once a week.  5. Continue checking blood sugars at home. 6. Continue making the lifestyle changes we've discussed together during our visit. Diet and exercise play a significant role in improving your blood sugars.  7. Follow-up with me in 1 month.    Hypoglycemia or low blood sugar:   Low blood sugar can happen quickly and may become an emergency if not treated right away.   While this shouldn't happen often, it can be brought upon if you skip a meal or do not eat enough. Also, if your insulin or other diabetes medications are dosed too high, this can cause your blood sugar to go to low.   Warning signs of low blood sugar include: 1. Feeling shaky or dizzy 2. Feeling weak or tired  3. Excessive hunger 4. Feeling anxious or upset  5. Sweating even when you aren't exercising  What to do if I experience low blood sugar? 1. Check your blood sugar with your meter. If lower than 70, proceed to step 2.  2. Treat with 3-4 glucose tablets or 3 packets of regular sugar. If these aren't around, you can try hard candy. Yet another option would be to drink 4 ounces of fruit juice or 6 ounces of REGULAR soda.  3. Re-check your sugar in 15 minutes. If it is still below 70, do what you did in step 2 again. If has come back up, go ahead and eat a snack or small meal at this time.

## 2020-01-29 NOTE — Progress Notes (Signed)
    S:     No chief complaint on file.  Patient arrives in good spirits. Presents for diabetes evaluation, education, and management. Patient was referred and last seen by Primary Care Provider on 01/14/2020.    Patient reports Diabetes was diagnosed in Jan, 2021. She has been on insulin and metformin. Has not tried other therapies. She does not have a personal or fhx of thyroid cancer. No hx of pancreatitis.   Family/Social History:  - FHx: "all of my family have it" - Tobacco: denies use  - Alcohol: occasionally   Insurance coverage/medication affordability: self pay  Medication adherence reported.   Current diabetes medications include: Basaglar 30u daily, Humalog 5 units TID + sliding scale, metformin 500 mg BID Current hypertension medications include: atorvastatin 20 mg daily  Current hyperlipidemia medications include: none   Patient denies hypoglycemic events.  Patient reported dietary habits:  - Eats 3 meals a day - Oatmilk/oatmeal, yogurt  - Lunch: vegetables, sandwich  - Dinner: salad   Patient-reported exercise habits: none outside of work; works ~10 hrs a day   Patient denies nocturia (nighttime urination).  Patient denies neuropathy (nerve pain). Patient denies visual changes. Patient reports self foot exams.     O:   Lab Results  Component Value Date   HGBA1C 13.6 (A) 01/14/2020   There were no vitals filed for this visit.  Lipid Panel     Component Value Date/Time   CHOL 260 (H) 01/14/2020 1220   TRIG 110 01/14/2020 1220   HDL 64 01/14/2020 1220   CHOLHDL 4.1 01/14/2020 1220   CHOLHDL 3 04/03/2016 0832   VLDL 9.0 04/03/2016 0832   LDLCALC 177 (H) 01/14/2020 1220   Home fasting blood sugars: reports mid-high 200s. No meter with her today.   Clinical Atherosclerotic Cardiovascular Disease (ASCVD): No  The ASCVD Risk score Denman George DC Jr., et al., 2013) failed to calculate for the following reasons:   The 2013 ASCVD risk score is only valid for  ages 87 to 43    A/P: Diabetes dx this year currently uncontrolled. Patient is able to verbalize appropriate hypoglycemia management plan. Medication adherence appears appropriate. -Increased dose of metformin to 1000 mg BID.  -Continue Basaglar 30 units daily.  -Discontinued  rapid insulin. -Started Trulicity 0.75 mg weekly. Counseling given on injection technique.  -Extensively discussed pathophysiology of diabetes, recommended lifestyle interventions, dietary effects on blood sugar control -Counseled on s/sx of and management of hypoglycemia -Next A1C anticipated 04/2020.   Written patient instructions provided.  Total time in face to face counseling 30 minutes.   Follow up Pharmacist Clinic Visit in 1 month.    Butch Penny, PharmD, [place]y [place], CPP Clinical Pharmacist [facility] [phone]

## 2020-02-03 ENCOUNTER — Ambulatory Visit: Payer: Self-pay | Admitting: Critical Care Medicine

## 2020-02-03 NOTE — Progress Notes (Deleted)
Subjective:    Patient ID: Jade Mullins, female    DOB: [birth year], 24 y.o.   MRN: [id number]  24 y.o.F here to est pcp 01/14/20 Had phone visit 03/2019 with Dr Chapman Fitch , no f/u since  At 03/2019 OV documentation as below   24 year old female, new to the practice seen status post hospitalization from 03/02/2019 through 03/05/2019 due to diabetic ketoacidosis with active problems of morbid obesity with BMI of 40-44.9 and diabetes mellitus type 2.  Per hospital notes, patient went to an urgent care and November of last year and had blood sugar around 500 and patient reported that she was diagnosed with prediabetes and prescribed metformin and subsequently lost 27 pounds with dietary changes.  She ran out of Metformin a week prior to presenting to the emergency department along with symptoms including vision changes, headaches, hypersomnolence, abdominal pain dry mouth and burning sensation on the tongue.  Hemoglobin A1c during admission was 15.3.  Potassium of 3.0 with last BMP done during hospitalization.  She reports that she is currently taking Lantus 30 units daily, short acting insulin as well as Metformin.  She reports no current issues with increased thirst, no blurred vision and no urinary frequency.  She denies any numbness or tingling in her feet.  She feels that her blood sugars are now well controlled.  She reports that she does have an appointment here on February 17 with clinical pharmacist.  She does need new refill of her Lantus.  She is checking her blood sugars before her evening meal and she states that the blood sugars are in the 160s.  She reports that she was told in the hospital to check her blood sugars before her evening meal.  Overall she feels well at this time.  On review of chart she does have history of sciatica but reports no current back pain.  She denies any chest pain or palpitations, no shortness of breath or cough, no headaches or dizziness, no abdominal pain-no nausea or  vomiting and no urinary frequency or dysuria.  Marland Kitchen Uncontrolled type 2 diabetes mellitus with hyperglycemia ([place]) 2. Type 2 diabetes mellitus without complication, with long-term current use of insulin ([place]) She reports improved control of her blood sugars and that she has an upcoming appointment with clinical pharmacist on February 17.  She will have blood work done at that time including lipid panel, urine microalbumin creatinine ratio and competence metabolic panel.  Refill provided of Lantus.  She is instructed to bring her glucometer and blood sugar diary to her upcoming visit.  Continue low carbohydrate diet, continue to be adherent with medication and continue monitoring of blood sugars. - Lipid Panel; Future - Comprehensive metabolic panel; Future - Insulin Glargine (LANTUS) 100 UNIT/ML Solostar Pen; Inject 30 Units into the skin daily.  Dispense: 15 mL; Refill: 3  3. Hypokalemia Patient with hypokalemia during hospitalization partially related to her high blood sugar with subsequent insulin therapy.  She will have repeat potassium as part of comprehensive metabolic panel at her upcoming visit. - Comprehensive metabolic panel; Future  The patient subsequently saw our clinical pharmacist later in February 2021 but has not been back since.  She struggled to get return visits with her primary care provider.  She then ran out of her insulin and all of her medications about 6 weeks ago.  On arrival blood sugar is 319 A1c is 13.  Patient does have a longstanding history of uncontrolled type 2 diabetes and had been admitted with DKA  earlier this year.  She is usually on insulin glargine 30 units daily and NovoLog 5 units 3 times daily  The patient states she has polyuria polydipsia and polyphagia, patient notes severe fatigue and weight loss as well.  Saw Luke 1X no f/u since  02/03/2020  Diabetes mellitus type 2 in obese (HCC) Type 2 diabetes poorly controlled off medications for 6  weeks  Resume Basaglar insulin 30 units daily and Humalog at 5 units 3 times daily with meals  Begin Lipitor 20 mg daily  Patient return in short-term follow-up 2 weeks with clinical pharmacist come back to see Dr. Joya Gaskins in 3 weeks  Morbid obesity with BMI of 40.0-44.9, adult ([place]) Morbid obesity with BMI of 38  Dietary advice given and meal plan given for diabetes   Jade Mullins was seen today for abnormal glucose.  Diagnoses and all orders for this visit:  Uncontrolled type 2 diabetes mellitus with hyperglycemia ([place]) -     Comprehensive metabolic panel -     CBC with Differential/Platelet -     Lipid panel -     Microalbumin / creatinine urine ratio -     Discontinue: insulin glargine (LANTUS) 100 UNIT/ML Solostar Pen; Inject 30 Units into the skin daily.  Type 2 diabetes mellitus without complication, with long-term current use of insulin (HCC) -     POCT glucose (manual entry) -     POCT glycosylated hemoglobin (Hb A1C) -     Discontinue: insulin glargine (LANTUS) 100 UNIT/ML Solostar Pen; Inject 30 Units into the skin daily.  Urinary frequency -     Cancel: POCT URINALYSIS DIP (CLINITEK)  Need for hepatitis C screening test -     HCV Ab w/Rflx to Verification  Diabetes mellitus type 2 in obese ([place])  Morbid obesity with BMI of 40.0-44.9, adult ([place])  Other orders -     metFORMIN (GLUCOPHAGE) 500 MG tablet; Take 1 tablet (500 mg total) by mouth 2 (two) times daily with a meal. -     Discontinue: insulin aspart (NOVOLOG) 100 UNIT/ML FlexPen; Inject 5 Units into the skin 3 (three) times daily with meals. And additional coverage per sliding scale insulin -     Insulin Pen Needle (PEN NEEDLES 3/16") 31G X 5 MM MISC; 1 application by Does not apply route 4 (four) times daily. -     Blood Glucose Monitoring Suppl (TRUE METRIX METER) w/Device KIT; Use to measure blood sugar twice a day -     TRUEplus Lancets 28G MISC; Use to measure blood sugar twice a day -      glucose blood (TRUE METRIX BLOOD GLUCOSE TEST) test strip; Use as instructed -     atorvastatin (LIPITOR) 20 MG tablet; Take 1 tablet (20 mg total) by mouth daily.   Patient did receive her flu vaccine this visit    Past Medical History:  Diagnosis Date  . Diabetes mellitus type 2 in obese ([place])   . Morbid obesity with BMI of 40.0-44.9, adult ([place])   . Sciatica      Family History  Problem Relation Age of Onset  . Arthritis Mother   . Diabetes Mother   . Diabetes Brother   . Hypertension Maternal Aunt   . Diabetes Maternal Aunt   . Cancer Maternal Aunt        prostate  . Hypertension Maternal Uncle   . Diabetes Maternal Uncle   . Arthritis Maternal Grandmother   . Heart disease Maternal Grandmother   . Hypertension  Maternal Grandmother   . Diabetes Maternal Grandmother      Social History   Socioeconomic History  . Marital status: Single    Spouse name: Not on file  . Number of children: Not on file  . Years of education: Not on file  . Highest education level: Not on file  Occupational History  . Occupation: Freight forwarder at [employer]  Tobacco Use  . Smoking status: Former Smoker    Packs/day: 0.00  . Smokeless tobacco: Never Used  . Tobacco comment: "like a month"  Substance and Sexual Activity  . Alcohol use: Yes    Alcohol/week: 0.0 standard drinks    Comment: none since 11/20  . Drug use: Not Currently    Types: Marijuana    Comment: last use over 6 months ago  . Sexual activity: Not Currently  Other Topics Concern  . Not on file  Social History Narrative  . Not on file   Social Determinants of Health   Financial Resource Strain: Not on file  Food Insecurity: Not on file  Transportation Needs: Not on file  Physical Activity: Not on file  Stress: Not on file  Social Connections: Not on file  Intimate Partner Violence: Not on file     Allergies  Allergen Reactions  . Naproxen     Lips swelling     Outpatient Medications Prior to Visit   Medication Sig Dispense Refill  . atorvastatin (LIPITOR) 20 MG tablet Take 1 tablet (20 mg total) by mouth daily. 90 tablet 3  . Blood Glucose Monitoring Suppl (TRUE METRIX METER) w/Device KIT Use to measure blood sugar twice a day 1 kit 0  . Dulaglutide (TRULICITY) 2.48 GN/0.0BB SOPN Inject 0.75 mg into the skin once a week. 2 mL 0  . glucose blood (TRUE METRIX BLOOD GLUCOSE TEST) test strip Use as instructed 100 each 12  . Insulin Glargine (BASAGLAR KWIKPEN) 100 UNIT/ML Inject 30 Units into the skin daily. 15 mL 2  . insulin lispro (HUMALOG KWIKPEN) 100 UNIT/ML KwikPen Inject 5 Units into the skin 3 (three) times daily. 15 mL 2  . Insulin Pen Needle (PEN NEEDLES 3/16") 31G X 5 MM MISC 1 application by Does not apply route 4 (four) times daily. 100 each 3  . metFORMIN (GLUCOPHAGE) 500 MG tablet Take 2 tablets (1,000 mg total) by mouth 2 (two) times daily with a meal. 120 tablet 2  . TRUEplus Lancets 28G MISC Use to measure blood sugar twice a day 100 each 1   No facility-administered medications prior to visit.      Review of Systems  Constitutional: Positive for fatigue and unexpected weight change.  HENT: Negative.  Negative for dental problem.   Eyes: Positive for visual disturbance.  Respiratory: Positive for shortness of breath. Negative for cough.   Cardiovascular: Negative for chest pain, palpitations and leg swelling.  Gastrointestinal: Positive for abdominal pain and nausea. Negative for diarrhea and vomiting.  Endocrine: Positive for polydipsia, polyphagia and polyuria. Negative for cold intolerance and heat intolerance.  Genitourinary: Negative.   Musculoskeletal: Negative.   Skin: Negative.   Allergic/Immunologic: Negative.   Neurological: Positive for headaches. Negative for dizziness, tremors, seizures, syncope, speech difficulty, weakness, light-headedness and numbness.  Hematological: Negative.   Psychiatric/Behavioral: Negative.        Objective:   Physical  Exam There were no vitals filed for this visit.  Gen: Pleasant, obese, in no distress,  normal affect  ENT: No lesions,  mouth clear,  oropharynx clear,  no postnasal drip  Neck: No JVD, no TMG, no carotid bruits  Lungs: No use of accessory muscles, no dullness to percussion, clear without rales or rhonchi  Cardiovascular: RRR, heart sounds normal, no murmur or gallops, no peripheral edema  Abdomen: soft and NT, no HSM,  BS normal  Musculoskeletal: No deformities, no cyanosis or clubbing  Neuro: alert, non focal  Skin: Warm, no lesions or rashes  Foot exam showed calluses on the heels dried feet but no other abnormalities     Assessment & Plan:  I personally reviewed all images and lab data in the [facility] system as well as any outside material available during this office visit and agree with the  radiology impressions.   No problem-specific Assessment & Plan notes found for this encounter.   There are no diagnoses linked to this encounter. Patient did receive her flu vaccine this visit

## 2020-02-26 ENCOUNTER — Other Ambulatory Visit: Payer: Self-pay | Admitting: Physician Assistant

## 2020-02-26 ENCOUNTER — Ambulatory Visit: Payer: Self-pay | Attending: Critical Care Medicine | Admitting: Physician Assistant

## 2020-02-26 ENCOUNTER — Other Ambulatory Visit: Payer: Self-pay

## 2020-02-26 ENCOUNTER — Encounter: Payer: Self-pay | Admitting: Physician Assistant

## 2020-02-26 VITALS — BP 114/77 | HR 65 | Temp 98.5°F | Ht 66.0 in | Wt 237.0 lb

## 2020-02-26 DIAGNOSIS — E1165 Type 2 diabetes mellitus with hyperglycemia: Secondary | ICD-10-CM

## 2020-02-26 DIAGNOSIS — E785 Hyperlipidemia, unspecified: Secondary | ICD-10-CM

## 2020-02-26 DIAGNOSIS — Z79899 Other long term (current) drug therapy: Secondary | ICD-10-CM

## 2020-02-26 LAB — GLUCOSE, POCT (MANUAL RESULT ENTRY): POC Glucose: 240 mg/dl — AB (ref 70–99)

## 2020-02-26 MED ORDER — BASAGLAR KWIKPEN 100 UNIT/ML ~~LOC~~ SOPN: 35.0000 [IU] | PEN_INJECTOR | Freq: Every day | SUBCUTANEOUS | 2 refills | Status: DC

## 2020-02-26 MED ORDER — LISINOPRIL 5 MG PO TABS: 2.5000 mg | ORAL_TABLET | Freq: Every day | ORAL | 3 refills | Status: DC

## 2020-02-26 MED ORDER — ATORVASTATIN CALCIUM 20 MG PO TABS: 20.0000 mg | ORAL_TABLET | Freq: Every day | ORAL | 3 refills | Status: DC

## 2020-02-26 MED ORDER — METFORMIN HCL 500 MG PO TABS: 1000.0000 mg | ORAL_TABLET | Freq: Two times a day (BID) | ORAL | 2 refills | Status: DC

## 2020-02-26 MED ORDER — TRULICITY 0.75 MG/0.5ML ~~LOC~~ SOAJ: 0.7500 mg | SUBCUTANEOUS | 0 refills | Status: DC

## 2020-02-26 MED ORDER — "PEN NEEDLES 3/16"" 31G X 5 MM MISC": 1.0000 "application " | Freq: Four times a day (QID) | 3 refills | Status: DC

## 2020-02-26 MED FILL — TRUEPLUS PEN NDL 31GX3/16: 31G X 5 MM | 25 days supply | Qty: 100 | Fill #0

## 2020-02-26 MED FILL — LISINOPRIL 5 MG TABLET: 5 | 30 days supply | Qty: 15 | Fill #0

## 2020-02-26 MED FILL — ?BASAGLAR 100 UNITS/ML KWPE: 100 | 42 days supply | Qty: 15 | Fill #0

## 2020-02-26 MED FILL — $TRULICITY 0.75 MG/0.5 ML P: 0.75 | 28 days supply | Qty: 2 | Fill #0

## 2020-02-26 MED FILL — ?ATORVASTATIN 20 MG TABLET: 20 | 30 days supply | Qty: 30 | Fill #0

## 2020-02-26 MED FILL — METFORMIN HCL 500 MG TABS: 500 | 30 days supply | Qty: 120 | Fill #0

## 2020-02-26 NOTE — Progress Notes (Signed)
Patient ID: Jade Mullins, female   DOB: 1996/02/05, 25 y.o.   MRN: [id number]   Jade Mullins, is a 25 y.o. female  WTU:[id number]  ZPH:[id number]  DOB - May 06, 1995  Subjective:  Chief Complaint and HPI: Jade Mullins is a 25 y.o. female here today for uncontrolled but improving blood sugars.  Most of her numbers now in the low to mid 200s.  None in the 300s.  She is trying to eat healthier.  compliant with current medication routine.  No hypoglycemic episodes.    ROS:   Constitutional:  No f/c, No night sweats, No unexplained weight loss. EENT:  No vision changes, No blurry vision, No hearing changes. No mouth, throat, or ear problems.  Respiratory: No cough, No SOB Cardiac: No CP, no palpitations GI:  No abd pain, No N/V/D. GU: No Urinary s/sx Musculoskeletal: No joint pain Neuro: No headache, no dizziness, no motor weakness.  Skin: No rash Endocrine:  No polydipsia. No polyuria.  Psych: Denies SI/HI  No problems updated.  ALLERGIES: Allergies  Allergen Reactions  . Naproxen     Lips swelling    PAST MEDICAL HISTORY: Past Medical History:  Diagnosis Date  . Diabetes mellitus type 2 in obese ([place])   . Morbid obesity with BMI of 40.0-44.9, adult ([place])   . Sciatica     MEDICATIONS AT HOME: Prior to Admission medications   Medication Sig Start Date End Date Taking? Authorizing Provider  Blood Glucose Monitoring Suppl (TRUE METRIX METER) w/Device KIT Use to measure blood sugar twice a day 01/14/20  Yes Elsie Stain, MD  glucose blood (TRUE METRIX BLOOD GLUCOSE TEST) test strip Use as instructed 01/14/20  Yes Elsie Stain, MD  lisinopril (ZESTRIL) 5 MG tablet Take 0.5 tablets (2.5 mg total) by mouth daily. 02/26/20  Yes Argentina Donovan, PA-C  TRUEplus Lancets 28G MISC Use to measure blood sugar twice a day 01/14/20  Yes Elsie Stain, MD  atorvastatin (LIPITOR) 20 MG tablet Take 1 tablet (20 mg total) by mouth daily. 02/26/20   [place] Donovan, PA-C   Dulaglutide (TRULICITY) 8.01 KP/5.3ZS SOPN Inject 0.75 mg into the skin once a week. 02/26/20   Ar[place] Donovan, PA-C  Insulin Glargine (BASAGLAR KWIKPEN) 100 UNIT/ML Inject 35 Units into the skin daily. 02/26/20   Ar[place] Donovan, PA-C  Insulin Pen Needle (PEN NEEDLES 3/16") 31G X 5 MM MISC 1 application by Does not apply route 4 (four) times daily. 02/26/20   Ar[place] Donovan, PA-C  metFORMIN (GLUCOPHAGE) 500 MG tablet Take 2 tablets (1,000 mg total) by mouth 2 (two) times daily with a meal. 02/26/20   Ryken Paschal, Dionne Bucy, PA-C     Objective:  EXAM:   Vitals:   02/26/20 0940  BP: 114/77  Pulse: 65  Temp: 98.5 F (36.9 C)  TempSrc: Oral  SpO2: 99%  Weight: 237 lb (107.5 kg)  Height: 5' 6"  (1.676 m)    General appearance : A&OX3. NAD. Non-toxic-appearing HEENT: Atraumatic and Normocephalic.  PERRLA. EOM intact.  Neck: supple, no JVD. No cervical lymphadenopathy. No thyromegaly Chest/Lungs:  Breathing-non-labored, Good air entry bilaterally, breath sounds normal without rales, rhonchi, or wheezing  CVS: S1 S2 regular, no murmurs, gallops, rubs  Extremities: Bilateral Lower Ext shows no edema, both legs are warm to touch with = pulse throughout Neurology:  CN II-XII grossly intact, Non focal.   Psych:  TP linear. J/I WNL. Normal speech. Appropriate eye contact and affect.  Skin:  No Rash  Data Review Lab Results  Component Value Date   HGBA1C 13.6 (A) 01/14/2020   HGBA1C 15.2 (H) 03/03/2019   HGBA1C >15.5 (H) 03/02/2019     Assessment & Plan   1. Uncontrolled type 2 diabetes mellitus with hyperglycemia (HCC) Improving but numbers still in low to mid 200s.  Check blood sugars fasting and bedtime and record and bring to next visit.  Increase basaglar to 35 and continue other meds.  Adding very low dose ace for kidney protection.  Drink 80-100 ounces water daily.  Check blood sugars fasting and at bedtime and record and bring to next visit.  Eliminate sugars and white  carbohydrates from your diet.   - Glucose (CBG) - Dulaglutide (TRULICITY) 1.91 YN/8.2NF SOPN; Inject 0.75 mg into the skin once a week.  Dispense: 2 mL; Refill: 0 - Insulin Glargine (BASAGLAR KWIKPEN) 100 UNIT/ML; Inject 35 Units into the skin daily.  Dispense: 15 mL; Refill: 2 - Comprehensive metabolic panel - metFORMIN (GLUCOPHAGE) 500 MG tablet; Take 2 tablets (1,000 mg total) by mouth 2 (two) times daily with a meal.  Dispense: 120 tablet; Refill: 2 - Insulin Pen Needle (PEN NEEDLES 3/16") 31G X 5 MM MISC; 1 application by Does not apply route 4 (four) times daily.  Dispense: 100 each; Refill: 3 - lisinopril (ZESTRIL) 5 MG tablet; Take 0.5 tablets (2.5 mg total) by mouth daily.  Dispense: 45 tablet; Refill: 3  2. Dyslipidemia continue - atorvastatin (LIPITOR) 20 MG tablet; Take 1 tablet (20 mg total) by mouth daily.  Dispense: 90 tablet; Refill: 3  3. High risk medication use - Comprehensive metabolic panel  Patient have been counseled extensively about nutrition and exercise  Return [facility] in 3 weeks; Dr Joya Gaskins in 2 months.  The patient was given clear instructions to go to ER or return to medical center if symptoms don't improve, worsen or new problems develop. The patient verbalized understanding. The patient was told to call to get lab results if they haven't heard anything in the next week.     Freeman Caldron, PA-C [facility] [place], [place]   02/26/2020, 9:59 AM

## 2020-02-26 NOTE — Patient Instructions (Signed)
Drink 80-100 ounces water daily.  Check blood sugars fasting and at bedtime and record and bring to next visit.  Eliminate sugars and white carbohydrates from your diet.

## 2020-02-27 LAB — COMPREHENSIVE METABOLIC PANEL
ALT: 7 IU/L (ref 0–32)
AST: 11 IU/L (ref 0–40)
Albumin/Globulin Ratio: 1.1 — ABNORMAL LOW (ref 1.2–2.2)
Albumin: 3.4 g/dL — ABNORMAL LOW (ref 3.9–5.0)
Alkaline Phosphatase: 70 IU/L (ref 44–121)
BUN/Creatinine Ratio: 10 (ref 9–23)
BUN: 7 mg/dL (ref 6–20)
Bilirubin Total: 0.2 mg/dL (ref 0.0–1.2)
CO2: 23 mmol/L (ref 20–29)
Calcium: 9.2 mg/dL (ref 8.7–10.2)
Chloride: 105 mmol/L (ref 96–106)
Creatinine, Ser: 0.72 mg/dL (ref 0.57–1.00)
GFR calc Af Amer: 135 mL/min/{1.73_m2} (ref >59)
GFR calc non Af Amer: 117 mL/min/{1.73_m2} (ref >59)
Globulin, Total: 3.1 g/dL (ref 1.5–4.5)
Glucose: 261 mg/dL — ABNORMAL HIGH (ref 65–99)
Potassium: 4.5 mmol/L (ref 3.5–5.2)
Sodium: 142 mmol/L (ref 134–144)
Total Protein: 6.5 g/dL (ref 6.0–8.5)

## 2020-03-01 ENCOUNTER — Ambulatory Visit: Payer: Self-pay | Admitting: Pharmacist

## 2020-03-03 MED FILL — $BASAGLAR 100 UNIT/ML KWIKP: 100 | 42 days supply | Qty: 15 | Fill #0

## 2020-03-18 ENCOUNTER — Other Ambulatory Visit: Payer: Self-pay | Admitting: Critical Care Medicine

## 2020-03-18 ENCOUNTER — Encounter: Payer: Self-pay | Admitting: Pharmacist

## 2020-03-18 ENCOUNTER — Other Ambulatory Visit: Payer: Self-pay

## 2020-03-18 ENCOUNTER — Ambulatory Visit: Payer: Self-pay | Attending: Critical Care Medicine | Admitting: Pharmacist

## 2020-03-18 DIAGNOSIS — E1165 Type 2 diabetes mellitus with hyperglycemia: Secondary | ICD-10-CM

## 2020-03-18 LAB — GLUCOSE, POCT (MANUAL RESULT ENTRY): POC Glucose: 138 mg/dL — AB (ref 70–99)

## 2020-03-18 MED ORDER — TRULICITY 1.5 MG/0.5ML ~~LOC~~ SOAJ: 1.5000 mg | SUBCUTANEOUS | 2 refills | Status: DC

## 2020-03-18 MED FILL — TRULICITY 1.5 MG/0.5 ML PEN: 1.5 | 28 days supply | Qty: 2 | Fill #0

## 2020-03-18 NOTE — Progress Notes (Signed)
    S:    PCP: Dr. Delford Field   No chief complaint on file.  Patient arrives in good spirits. Presents for diabetes evaluation, education, and management. Patient was referred and last seen by Sara Chu on 02/26/2020. At that visit, pt's Basaglar was increased.   Today, pt reports that she is doing well. Of note, she denies NV, abdominal pain with the Trulicity. She does admit to taking an occasional Humalog dose with her largest meal if she sees home CBGs > 200. She takes 4u when this happens. She is compliant with all other medications.    Family/Social History:  - FHx: DM, arthritis, HTN, heart disease - Tobacco: current some day smoker (cigars) - Alcohol: none since 12/2018  Insurance coverage/medication affordability: self pay  Medication adherence reported.   Current diabetes medications include: dulaglutide 0.75 mg weekly, insulin glargine 35 units daily, metformin 1000 mg BID. Of note, she self doses Humalog 4 units prn if CBGs at home are >200  Current hypertension medications include: none Current hyperlipidemia medications include: atorvastatin 20 mg daily  Patient denies hypoglycemic events.  Patient reported dietary habits:  - "I'm trying to stay away from my sweets"   Patient-reported exercise habits:  - Has a membership to [employer] daily as well    Patient denies nocturia (nighttime urination).  Patient denies neuropathy (nerve pain). Patient denies visual changes. Patient reports self foot exams.     O:  POCT: 138  Lab Results  Component Value Date   HGBA1C 13.6 (A) 01/14/2020   There were no vitals filed for this visit.  Lipid Panel     Component Value Date/Time   CHOL 260 (H) 01/14/2020 1220   TRIG 110 01/14/2020 1220   HDL 64 01/14/2020 1220   CHOLHDL 4.1 01/14/2020 1220   CHOLHDL 3 04/03/2016 0832   VLDL 9.0 04/03/2016 0832   LDLCALC 177 (H) 01/14/2020 1220   Home fasting blood sugars: endorses seeing mostly 180s - 220s. Has  observed an increase number of values in the 100s since last visit.    Clinical Atherosclerotic Cardiovascular Disease (ASCVD): No  The ASCVD Risk score Denman George DC Jr., et al., 2013) failed to calculate for the following reasons:   The 2013 ASCVD risk score is only valid for ages 2 to 4    A/P: Diabetes longstanding currently uncontrolled however home CBGs are improving. Patient is able to verbalize appropriate hypoglycemia management plan. Medication adherence appears appropriate. -Increase Trulicity to 1.5 mg weekly -Continue metformin, Basaglar at current doses.  -Stop prn dosing of Humalog -Extensively discussed pathophysiology of diabetes, recommended lifestyle interventions, dietary effects on blood sugar control -Counseled on s/sx of and management of hypoglycemia -Next A1C anticipated 04/2020.   ASCVD risk - primary prevention in patient with diabetes. Last LDL of 177. ASCVD risk cannot be calculated d/t age.  -Continued atorvastatin 20 mg   Written patient instructions provided.  Total time in face to face counseling 30 minutes.   Follow up PCP Clinic Visit in March.     Patient seen with : Adam Phenix, PharmD Candidate  [employer]  Class of 2024  Butch Penny, PharmD, [place], CPP Clinical Pharmacist [facility] [phone]

## 2020-04-27 ENCOUNTER — Other Ambulatory Visit: Payer: Self-pay | Admitting: Critical Care Medicine

## 2020-04-27 ENCOUNTER — Ambulatory Visit: Payer: Self-pay | Attending: Critical Care Medicine | Admitting: Critical Care Medicine

## 2020-04-27 ENCOUNTER — Other Ambulatory Visit: Payer: Self-pay

## 2020-04-27 ENCOUNTER — Encounter: Payer: Self-pay | Admitting: Critical Care Medicine

## 2020-04-27 VITALS — BP 126/91 | HR 70 | Resp 16 | Wt 233.0 lb

## 2020-04-27 DIAGNOSIS — E1169 Type 2 diabetes mellitus with other specified complication: Secondary | ICD-10-CM

## 2020-04-27 DIAGNOSIS — Z6841 Body Mass Index (BMI) 40.0 and over, adult: Secondary | ICD-10-CM

## 2020-04-27 DIAGNOSIS — E1165 Type 2 diabetes mellitus with hyperglycemia: Secondary | ICD-10-CM

## 2020-04-27 DIAGNOSIS — I10 Essential (primary) hypertension: Secondary | ICD-10-CM

## 2020-04-27 DIAGNOSIS — E669 Obesity, unspecified: Secondary | ICD-10-CM

## 2020-04-27 DIAGNOSIS — Z124 Encounter for screening for malignant neoplasm of cervix: Secondary | ICD-10-CM

## 2020-04-27 DIAGNOSIS — Z720 Tobacco use: Secondary | ICD-10-CM | POA: Insufficient documentation

## 2020-04-27 HISTORY — DX: Type 2 diabetes mellitus with hyperglycemia: E11.65

## 2020-04-27 LAB — POCT GLYCOSYLATED HEMOGLOBIN (HGB A1C): HbA1c, POC (controlled diabetic range): 6.7 % (ref 0.0–7.0)

## 2020-04-27 LAB — GLUCOSE, POCT (MANUAL RESULT ENTRY): POC Glucose: 158 mg/dl — AB (ref 70–99)

## 2020-04-27 MED ORDER — METFORMIN HCL 500 MG PO TABS: 1000.0000 mg | ORAL_TABLET | Freq: Two times a day (BID) | ORAL | 2 refills | Status: DC

## 2020-04-27 MED ORDER — TRULICITY 1.5 MG/0.5ML ~~LOC~~ SOAJ
1.5000 mg | SUBCUTANEOUS | 2 refills | Status: DC
Start: 2020-04-27 — End: 2020-04-29

## 2020-04-27 MED ORDER — LISINOPRIL 5 MG PO TABS: 2.5000 mg | ORAL_TABLET | Freq: Every day | ORAL | 3 refills | Status: DC

## 2020-04-27 MED ORDER — "PEN NEEDLES 3/16"" 31G X 5 MM MISC": 1.0000 "application " | Freq: Four times a day (QID) | 3 refills | Status: DC

## 2020-04-27 MED ORDER — BASAGLAR KWIKPEN 100 UNIT/ML ~~LOC~~ SOPN: 35.0000 [IU] | PEN_INJECTOR | Freq: Every day | SUBCUTANEOUS | 2 refills | Status: DC

## 2020-04-27 MED ORDER — NICOTINE POLACRILEX 4 MG MT LOZG: LOZENGE | OROMUCOSAL | 4 refills | Status: DC

## 2020-04-27 MED FILL — ?TRULICITY 1.5 MG/0.5 ML PE: 1.5 | 28 days supply | Qty: 2 | Fill #1

## 2020-04-27 MED FILL — ?BASAGLAR 100 UNITS/ML KWPE: 100 | 42 days supply | Qty: 15 | Fill #0

## 2020-04-27 NOTE — Patient Instructions (Signed)
COVID-19 Vaccine Information can be found at: [email or web address] For questions related to vaccine distribution or appointments, please email [email or web address] or call [phone].    No change in medications  Keep up the good work with your diet and exercise program  Focus on smoking cessation I will send a nicotine lozenge prescription to the pharmacy  A Pap smear needs to be performed referral to gynecology made  We discussed the importance of an HPV human papilloma virus vaccine to prevent cervical cancer you will give this consideration  Please consider a COVID vaccine series to be started  Return Dr. Delford Field 3 months

## 2020-04-27 NOTE — Assessment & Plan Note (Signed)
Type 2 diabetes markedly improved A1c down to 6.7  Continue diabetic diet increase gradually exercise and continue insulin and Trulicity dosing Metformin as prescribed

## 2020-04-27 NOTE — Assessment & Plan Note (Signed)
As per diabetes type 2 assessment and obese

## 2020-04-27 NOTE — Assessment & Plan Note (Signed)
  .   Current smoking consumption amount: 2 cigarillos a day  . Dicsussion on advise to quit smoking and smoking impacts: Cardiovascular impacts  . Patient's willingness to quit: Willing to quit  . Methods to quit smoking discussed: Behavioral modification and nicotine replacement  . Medication management of smoking session drugs discussed: Nicotine lozenge 4 mg twice daily  . Resources provided:  AVS   . Setting quit date not established  . Follow-up arranged 3 months   Time spent counseling the patient: 5 minutes

## 2020-04-27 NOTE — Assessment & Plan Note (Signed)
Continue low-dose lisinopril for now

## 2020-04-27 NOTE — Progress Notes (Signed)
Subjective:    Patient ID: Jade Mullins, female    DOB: 07/16/1995, 25 y.o.   MRN: [id number]  24 y.o.F here to est pcp Had phone visit 03/2019 with Dr Chapman Fitch , no f/u since  At 03/2019 OV documentation as below   25 year old female, new to the practice seen status post hospitalization from 03/02/2019 through 03/05/2019 due to diabetic ketoacidosis with active problems of morbid obesity with BMI of 40-44.9 and diabetes mellitus type 2.  Per hospital notes, patient went to an urgent care and November of last year and had blood sugar around 500 and patient reported that she was diagnosed with prediabetes and prescribed metformin and subsequently lost 27 pounds with dietary changes.  She ran out of Metformin a week prior to presenting to the emergency department along with symptoms including vision changes, headaches, hypersomnolence, abdominal pain dry mouth and burning sensation on the tongue.  Hemoglobin A1c during admission was 15.3.  Potassium of 3.0 with last BMP done during hospitalization.  She reports that she is currently taking Lantus 30 units daily, short acting insulin as well as Metformin.  She reports no current issues with increased thirst, no blurred vision and no urinary frequency.  She denies any numbness or tingling in her feet.  She feels that her blood sugars are now well controlled.  She reports that she does have an appointment here on February 17 with clinical pharmacist.  She does need new refill of her Lantus.  She is checking her blood sugars before her evening meal and she states that the blood sugars are in the 160s.  She reports that she was told in the hospital to check her blood sugars before her evening meal.  Overall she feels well at this time.  On review of chart she does have history of sciatica but reports no current back pain.  She denies any chest pain or palpitations, no shortness of breath or cough, no headaches or dizziness, no abdominal pain-no nausea or vomiting and  no urinary frequency or dysuria.  Marland Kitchen Uncontrolled type 2 diabetes mellitus with hyperglycemia ([place]) 2. Type 2 diabetes mellitus without complication, with long-term current use of insulin ([place]) She reports improved control of her blood sugars and that she has an upcoming appointment with clinical pharmacist on February 17.  She will have blood work done at that time including lipid panel, urine microalbumin creatinine ratio and competence metabolic panel.  Refill provided of Lantus.  She is instructed to bring her glucometer and blood sugar diary to her upcoming visit.  Continue low carbohydrate diet, continue to be adherent with medication and continue monitoring of blood sugars. - Lipid Panel; Future - Comprehensive metabolic panel; Future - Insulin Glargine (LANTUS) 100 UNIT/ML Solostar Pen; Inject 30 Units into the skin daily.  Dispense: 15 mL; Refill: 3  3. Hypokalemia Patient with hypokalemia during hospitalization partially related to her high blood sugar with subsequent insulin therapy.  She will have repeat potassium as part of comprehensive metabolic panel at her upcoming visit. - Comprehensive metabolic panel; Future  The patient subsequently saw our clinical pharmacist later in February 2021 but has not been back since.  She struggled to get return visits with her primary care provider.  She then ran out of her insulin and all of her medications about 6 weeks ago.  On arrival blood sugar is 319 A1c is 13.  Patient does have a longstanding history of uncontrolled type 2 diabetes and had been admitted with DKA earlier  this year.  She is usually on insulin glargine 30 units daily and NovoLog 5 units 3 times daily  The patient states she has polyuria polydipsia and polyphagia, patient notes severe fatigue and weight loss as well.  Saw Luke 1X no f/u since  04/27/2020 This patient is seen in return follow-up for severe diabetes poorly controlled.  She is back on her insulin Basaglar and  Humalog and Lipitor.  She is lost 4 pounds in weight.  She is more compliant with her diet.  On arrival hemoglobin A1c is down to 6.7 her blood glucoses have been in the 120 250 range at home.  She is still smoking but down to 1 to 2 cigarellas daily.  She is in need of a Pap smear.  Overall her symptom complex is improved.  Note she is yet to receive the COVID vaccine.   Past Medical History:  Diagnosis Date  . Diabetes mellitus type 2 in obese ([place])   . Morbid obesity with BMI of 40.0-44.9, adult ([place])   . Sciatica   . Uncontrolled type 2 diabetes mellitus with hyperglycemia ([place]) 04/27/2020     Family History  Problem Relation Age of Onset  . Arthritis Mother   . Diabetes Mother   . Diabetes Brother   . Hypertension Maternal Aunt   . Diabetes Maternal Aunt   . Cancer Maternal Aunt        prostate  . Hypertension Maternal Uncle   . Diabetes Maternal Uncle   . Arthritis Maternal Grandmother   . Heart disease Maternal Grandmother   . Hypertension Maternal Grandmother   . Diabetes Maternal Grandmother      Social History   Socioeconomic History  . Marital status: Single    Spouse name: Not on file  . Number of children: Not on file  . Years of education: Not on file  . Highest education level: Not on file  Occupational History  . Occupation: Freight forwarder at [employer]  Tobacco Use  . Smoking status: Current Some Day Smoker    Packs/day: 0.00    Types: Cigars  . Smokeless tobacco: Never Used  . Tobacco comment: "like a month"  Substance and Sexual Activity  . Alcohol use: Yes    Alcohol/week: 0.0 standard drinks    Comment: none since 11/20  . Drug use: Not Currently    Types: Marijuana    Comment: last use over 6 months ago  . Sexual activity: Not Currently  Other Topics Concern  . Not on file  Social History Narrative  . Not on file   Social Determinants of Health   Financial Resource Strain: Not on file  Food Insecurity: Not on file  Transportation Needs:  Not on file  Physical Activity: Not on file  Stress: Not on file  Social Connections: Not on file  Intimate Partner Violence: Not on file     Allergies  Allergen Reactions  . Naproxen     Lips swelling     Outpatient Medications Prior to Visit  Medication Sig Dispense Refill  . atorvastatin (LIPITOR) 20 MG tablet Take 1 tablet (20 mg total) by mouth daily. 90 tablet 3  . Blood Glucose Monitoring Suppl (TRUE METRIX METER) w/Device KIT Use to measure blood sugar twice a day 1 kit 0  . glucose blood (TRUE METRIX BLOOD GLUCOSE TEST) test strip Use as instructed 100 each 12  . TRUEplus Lancets 28G MISC Use to measure blood sugar twice a day 100 each 1  .  Dulaglutide (TRULICITY) 1.5 PR/9.4VO SOPN Inject 1.5 mg into the skin once a week. 2 mL 2  . Insulin Glargine (BASAGLAR KWIKPEN) 100 UNIT/ML Inject 35 Units into the skin daily. 15 mL 2  . Insulin Pen Needle (PEN NEEDLES 3/16") 31G X 5 MM MISC 1 application by Does not apply route 4 (four) times daily. 100 each 3  . lisinopril (ZESTRIL) 5 MG tablet Take 0.5 tablets (2.5 mg total) by mouth daily. 45 tablet 3  . metFORMIN (GLUCOPHAGE) 500 MG tablet Take 2 tablets (1,000 mg total) by mouth 2 (two) times daily with a meal. 120 tablet 2   No facility-administered medications prior to visit.      Review of Systems  Constitutional: Negative for fatigue.  HENT: Negative.  Negative for dental problem.   Eyes: Negative for visual disturbance.  Respiratory: Negative for cough and shortness of breath.   Cardiovascular: Negative for chest pain, palpitations and leg swelling.  Gastrointestinal: Negative for abdominal pain, diarrhea, nausea and vomiting.  Endocrine: Negative for cold intolerance, heat intolerance, polydipsia, polyphagia and polyuria.  Genitourinary: Negative.   Musculoskeletal: Negative.   Skin: Negative.   Allergic/Immunologic: Negative.   Neurological: Negative for dizziness, tremors, seizures, syncope, speech difficulty,  weakness, light-headedness, numbness and headaches.  Hematological: Negative.   Psychiatric/Behavioral: Negative.        Objective:   Physical Exam Vitals:   04/27/20 0849  BP: (!) 126/91  Pulse: 70  Resp: 16  SpO2: 99%  Weight: 233 lb (105.7 kg)    Gen: Pleasant, obese, in no distress,  normal affect  ENT: No lesions,  mouth clear,  oropharynx clear, no postnasal drip  Neck: No JVD, no TMG, no carotid bruits  Lungs: No use of accessory muscles, no dullness to percussion, clear without rales or rhonchi  Cardiovascular: RRR, heart sounds normal, no murmur or gallops, no peripheral edema  Abdomen: soft and NT, no HSM,  BS normal  Musculoskeletal: No deformities, no cyanosis or clubbing  Neuro: alert, non focal  Skin: Warm, no lesions or rashes      Assessment & Plan:  I personally reviewed all images and lab data in the [facility] system as well as any outside material available during this office visit and agree with the  radiology impressions.   Morbid obesity with BMI of 40.0-44.9, adult ([place]) Patient is lost 4 pounds in weight further dietary education given at this visit  Diabetes mellitus type 2 in obese ([place]) Type 2 diabetes markedly improved A1c down to 6.7  Continue diabetic diet increase gradually exercise and continue insulin and Trulicity dosing Metformin as prescribed  HTN (hypertension) Continue low-dose lisinopril for now  Uncontrolled type 2 diabetes mellitus with hyperglycemia ([place]) As per diabetes type 2 assessment and obese  Tobacco abuse    . Current smoking consumption amount: 2 cigarillos a day  . Dicsussion on advise to quit smoking and smoking impacts: Cardiovascular impacts  . Patient's willingness to quit: Willing to quit  . Methods to quit smoking discussed: Behavioral modification and nicotine replacement  . Medication management of smoking session drugs discussed: Nicotine lozenge 4 mg twice daily  . Resources provided:  AVS    . Setting quit date not established  . Follow-up arranged 3 months   Time spent counseling the patient: 5 minutes     Jade Mullins was seen today for diabetes.  Diagnoses and all orders for this visit:  Uncontrolled type 2 diabetes mellitus with hyperglycemia ([place]) -     POCT  glucose (manual entry) -     POCT glycosylated hemoglobin (Hb A1C) -     Microalbumin / creatinine urine ratio -     Insulin Glargine (BASAGLAR KWIKPEN) 100 UNIT/ML; Inject 35 Units into the skin daily. -     Insulin Pen Needle (PEN NEEDLES 3/16") 31G X 5 MM MISC; 1 application by Does not apply route 4 (four) times daily. -     lisinopril (ZESTRIL) 5 MG tablet; Take 0.5 tablets (2.5 mg total) by mouth daily. -     metFORMIN (GLUCOPHAGE) 500 MG tablet; Take 2 tablets (1,000 mg total) by mouth 2 (two) times daily with a meal.  Cervical cancer screening -     Ambulatory referral to Gynecology  Morbid obesity with BMI of 40.0-44.9, adult ([place])  Diabetes mellitus type 2 in obese ([place])  Primary hypertension  Tobacco abuse  Other orders -     Dulaglutide (TRULICITY) 1.5 PI/9.5JO SOPN; Inject 1.5 mg into the skin once a week. -     nicotine polacrilex (NICORETTE MINI) 4 MG lozenge; Use twice a day to stop smoking   Refer to gynecology for Pap smear

## 2020-04-27 NOTE — Assessment & Plan Note (Signed)
Patient is lost 4 pounds in weight further dietary education given at this visit

## 2020-04-28 LAB — MICROALBUMIN / CREATININE URINE RATIO
Creatinine, Urine: 131.6 mg/dL
Microalb/Creat Ratio: 4 mg/g creat (ref 0–29)
Microalbumin, Urine: 4.8 ug/mL

## 2020-04-29 ENCOUNTER — Other Ambulatory Visit: Payer: Self-pay | Admitting: Pharmacist

## 2020-04-29 MED ORDER — TRULICITY 1.5 MG/0.5ML ~~LOC~~ SOAJ: 1.5000 mg | SUBCUTANEOUS | 2 refills | Status: DC

## 2020-06-08 ENCOUNTER — Ambulatory Visit (HOSPITAL_BASED_OUTPATIENT_CLINIC_OR_DEPARTMENT_OTHER): Payer: Self-pay | Admitting: Obstetrics & Gynecology

## 2020-06-10 ENCOUNTER — Other Ambulatory Visit: Payer: Self-pay

## 2020-06-30 ENCOUNTER — Other Ambulatory Visit: Payer: Self-pay

## 2020-07-27 ENCOUNTER — Ambulatory Visit: Payer: Self-pay | Admitting: Critical Care Medicine

## 2020-08-02 ENCOUNTER — Ambulatory Visit: Payer: Self-pay | Admitting: Critical Care Medicine

## 2021-01-04 ENCOUNTER — Ambulatory Visit (HOSPITAL_COMMUNITY)
Admission: EM | Admit: 2021-01-04 | Discharge: 2021-01-04 | Disposition: A | Discharge status: Home / Self Care | Payer: Self-pay | Attending: Emergency Medicine | Admitting: Emergency Medicine

## 2021-01-04 ENCOUNTER — Encounter (HOSPITAL_COMMUNITY): Payer: Self-pay | Admitting: Emergency Medicine

## 2021-01-04 ENCOUNTER — Other Ambulatory Visit: Payer: Self-pay

## 2021-01-04 DIAGNOSIS — J101 Influenza due to other identified influenza virus with other respiratory manifestations: Secondary | ICD-10-CM

## 2021-01-04 LAB — POC INFLUENZA A AND B ANTIGEN (URGENT CARE ONLY)
INFLUENZA A ANTIGEN, POC: POSITIVE — AB
INFLUENZA B ANTIGEN, POC: NEGATIVE

## 2021-01-04 MED ORDER — CYCLOBENZAPRINE HCL 5 MG PO TABS: 5.0000 mg | ORAL_TABLET | Freq: Every day | ORAL | 0 refills | Status: DC

## 2021-01-04 MED ORDER — IBUPROFEN 800 MG PO TABS: 800.0000 mg | ORAL_TABLET | Freq: Three times a day (TID) | ORAL | 0 refills | Status: DC

## 2021-01-04 MED ORDER — OSELTAMIVIR PHOSPHATE 75 MG PO CAPS: 75.0000 mg | ORAL_CAPSULE | Freq: Two times a day (BID) | ORAL | 0 refills | Status: DC

## 2021-01-04 MED ORDER — ALBUTEROL SULFATE HFA 108 (90 BASE) MCG/ACT IN AERS: 2.0000 | INHALATION_SPRAY | RESPIRATORY_TRACT | 0 refills | Status: DC | PRN

## 2021-01-04 NOTE — Discharge Instructions (Addendum)
Your flu test is positive, take tamiflu twice a day for 5 days  Can use ibuprofen every 8 hours as needed   Can use muscle relaxer at bedtime as needed   Can take 2 puffs of inhaler as needed every 4 hours for wheezing or shortness of breath   For cough: honey 1/2 to 1 teaspoon (you can dilute the honey in water or another fluid).  You can also use guaifenesin and dextromethorphan for cough. You can use a humidifier for chest congestion and cough.  If you don't have a humidifier, you can sit in the bathroom with the hot shower running.      For sore throat: try warm salt water gargles, cepacol lozenges, throat spray, warm tea or water with lemon/honey, popsicles or ice, or OTC cold relief medicine for throat discomfort.   For congestion: take a daily anti-histamine like Zyrtec, Claritin, and a oral decongestant, such as pseudoephedrine.  You can also use Flonase 1-2 sprays in each nostril daily.   It is important to stay hydrated: drink plenty of fluids (water, gatorade/powerade/pedialyte, juices, or teas) to keep your throat moisturized and help further relieve irritation/discomfort.

## 2021-01-04 NOTE — ED Triage Notes (Signed)
Since Sunday having headache, congestion, fevers, body aches.
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
## 2021-06-15 ENCOUNTER — Emergency Department
Admission: EM | Admit: 2021-06-15 | Discharge: 2021-06-15 | Discharge status: Against Medical Advice | Payer: Self-pay | Source: Home / Self Care

## 2021-06-24 IMAGING — DX DG CHEST 2V
2 series · 2 of 2 positions shown · non-contrast
Comparison: None.

CLINICAL DATA: Pt presents with chest congestion, sore throat, body
aches, and headache xs 4 days. OTC medications are not giving
relief.

EXAM:
CHEST - 2 VIEW

[chest pa]
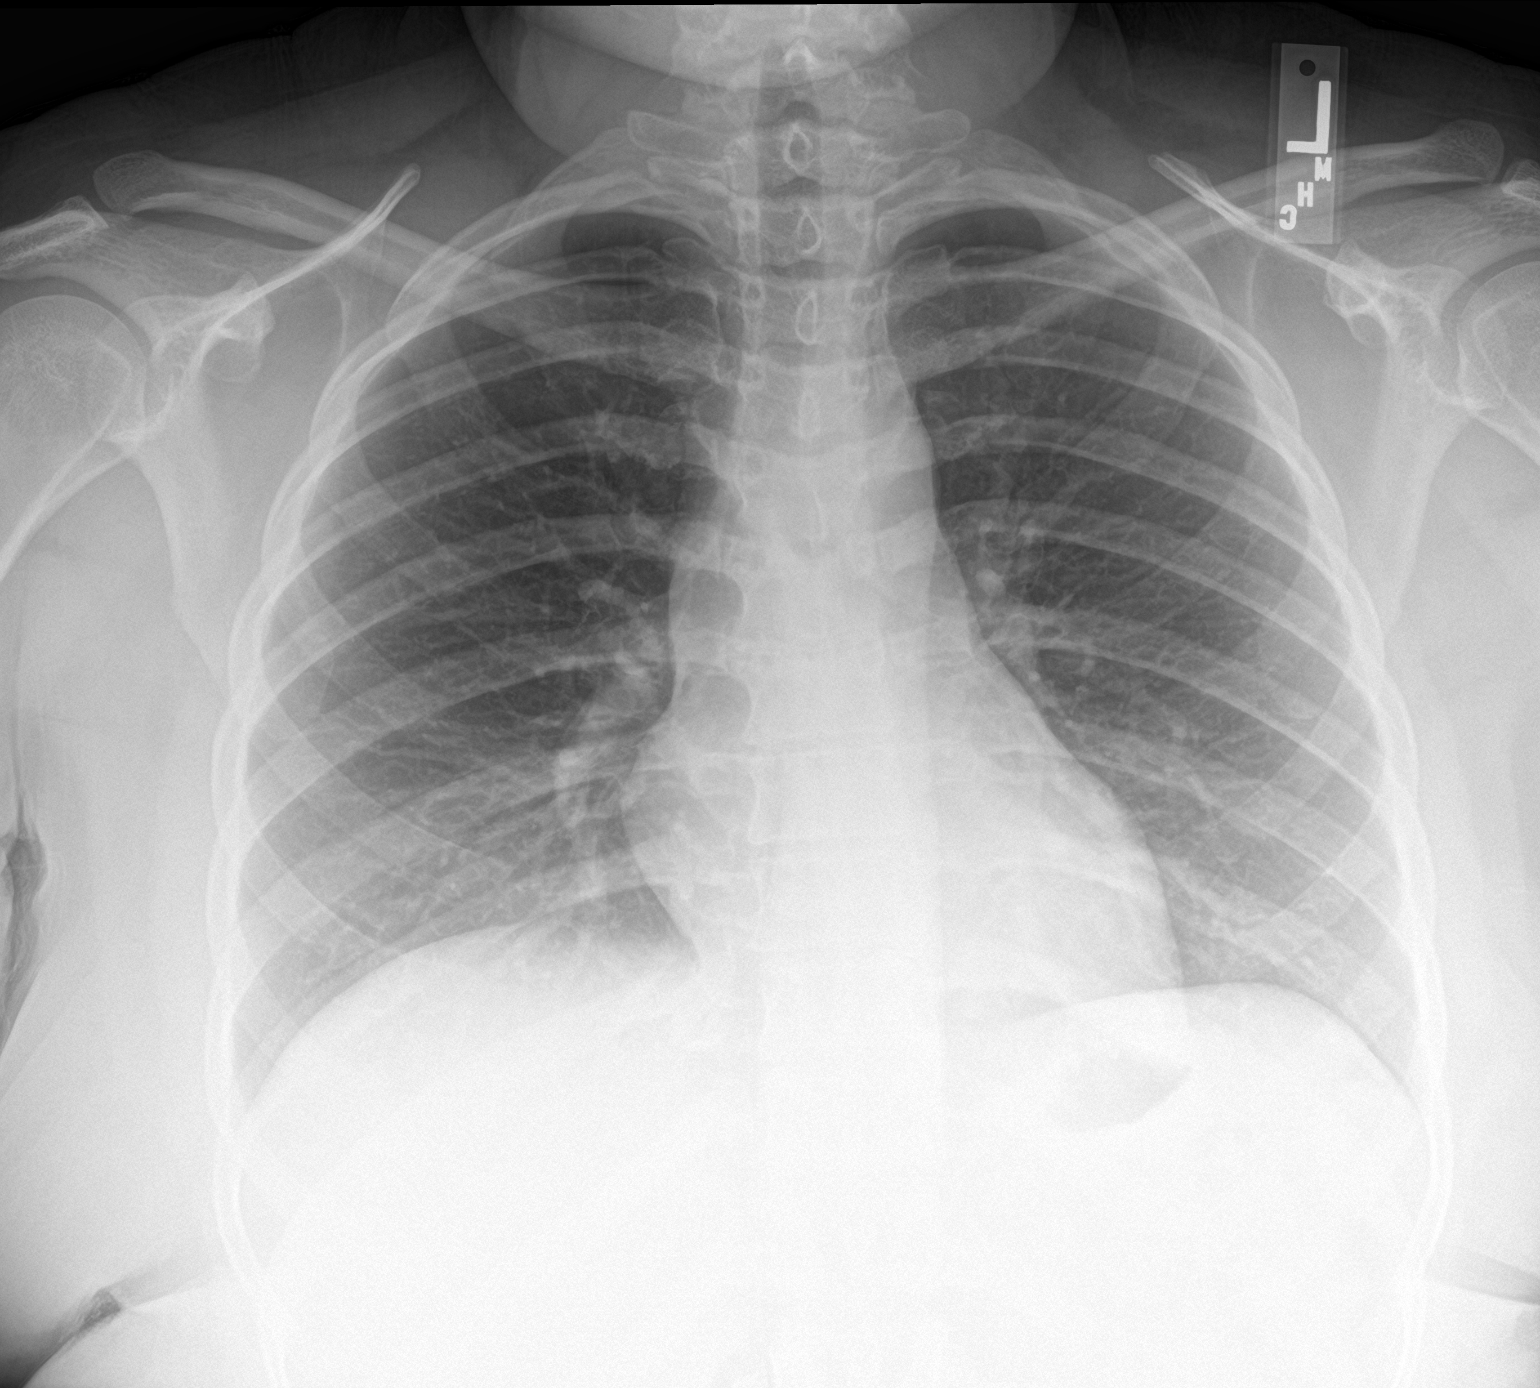

[chest lat]
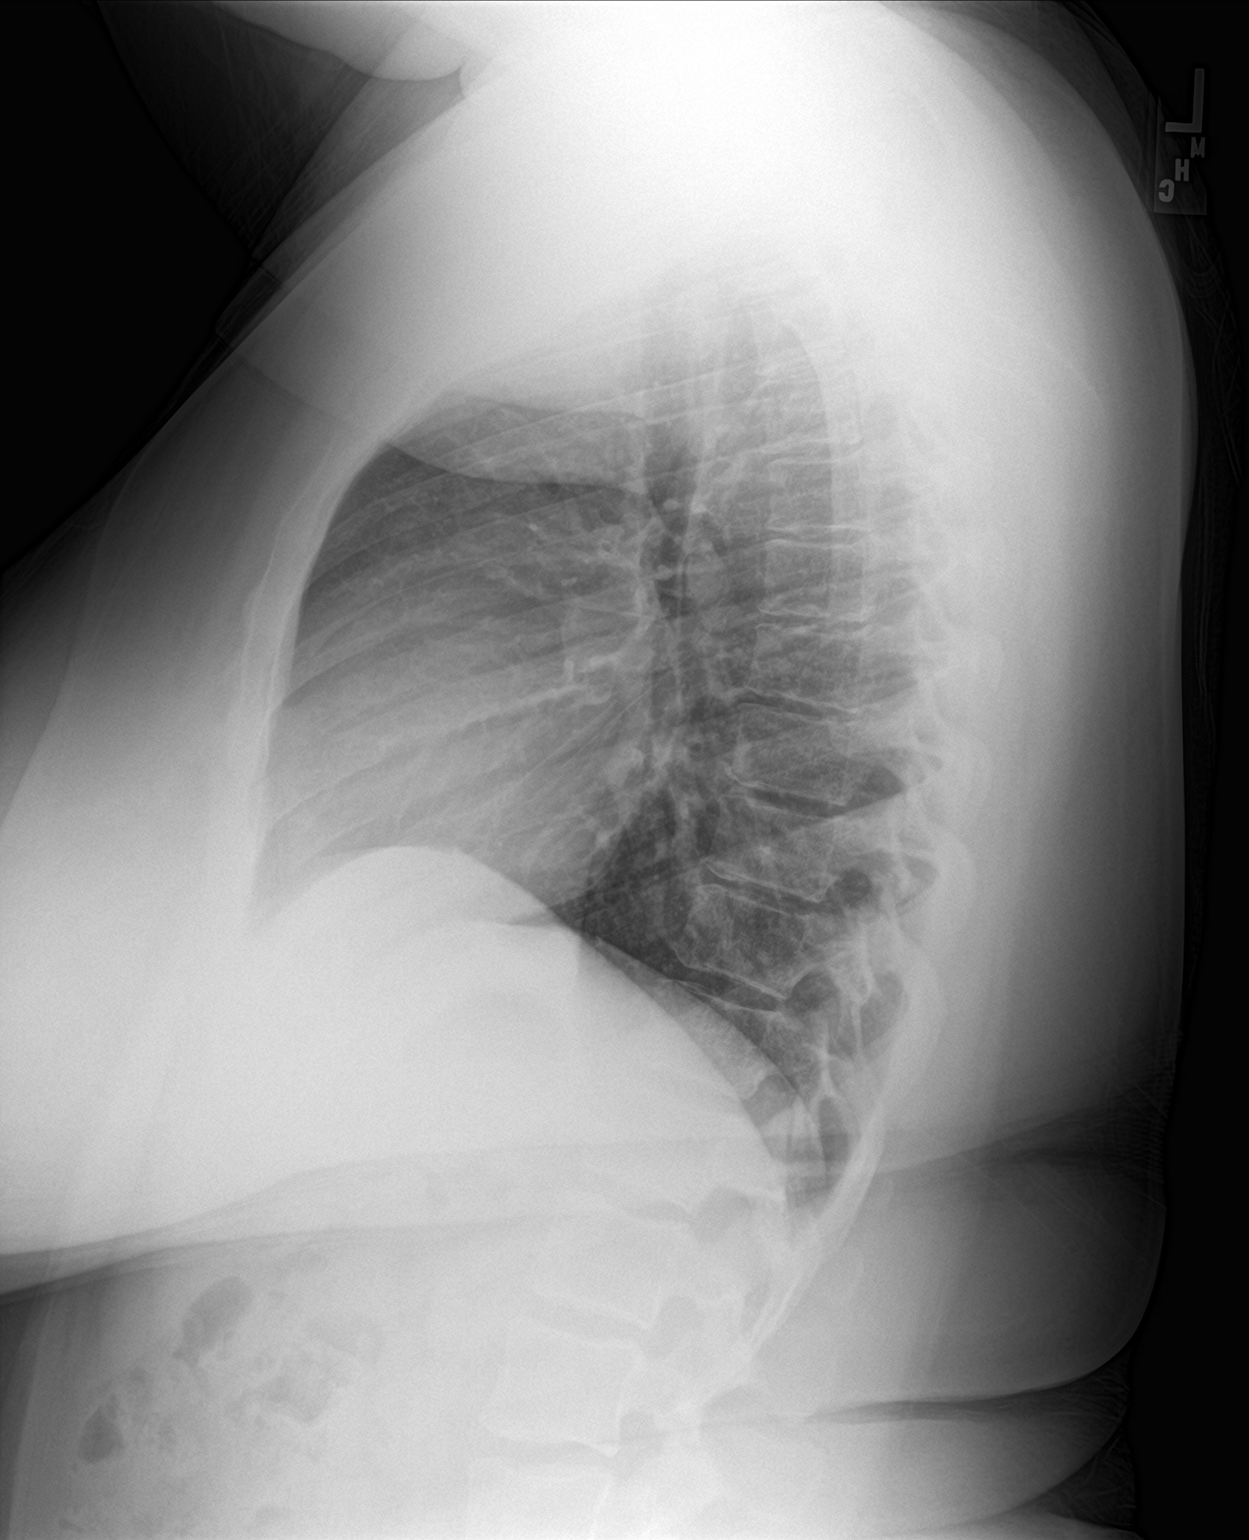

[2 of 2 positions shown; findings below may reference images not displayed]

FINDINGS: The cardiomediastinal contours are within normal limits. The lungs
are clear. No pneumothorax or pleural effusion. No acute finding in
the visualized skeleton.
IMPRESSION: No active cardiopulmonary disease.

## 2021-08-29 ENCOUNTER — Emergency Department (HOSPITAL_COMMUNITY)
Admission: EM | Admit: 2021-08-29 | Discharge: 2021-08-30 | Disposition: A | Discharge status: Home / Self Care | Payer: Self-pay | Attending: Emergency Medicine | Admitting: Emergency Medicine

## 2021-08-29 ENCOUNTER — Encounter (HOSPITAL_COMMUNITY): Payer: Self-pay

## 2021-08-29 ENCOUNTER — Emergency Department (HOSPITAL_COMMUNITY): Payer: Self-pay

## 2021-08-29 DIAGNOSIS — S0990XA Unspecified injury of head, initial encounter: Secondary | ICD-10-CM | POA: Insufficient documentation

## 2021-08-29 DIAGNOSIS — Z794 Long term (current) use of insulin: Secondary | ICD-10-CM | POA: Insufficient documentation

## 2021-08-29 DIAGNOSIS — S0993XA Unspecified injury of face, initial encounter: Secondary | ICD-10-CM

## 2021-08-29 DIAGNOSIS — Z79899 Other long term (current) drug therapy: Secondary | ICD-10-CM | POA: Insufficient documentation

## 2021-08-29 DIAGNOSIS — X58XXXA Exposure to other specified factors, initial encounter: Secondary | ICD-10-CM | POA: Insufficient documentation

## 2021-08-29 DIAGNOSIS — H209 Unspecified iridocyclitis: Secondary | ICD-10-CM

## 2021-08-29 MED ORDER — ACETAMINOPHEN 325 MG PO TABS
650.0000 mg | ORAL_TABLET | Freq: Once | ORAL | Status: AC
  Administered 2021-08-30: 650 mg via ORAL
  Filled 2021-08-29: qty 2

## 2021-08-29 MED ORDER — CYCLOPENTOLATE HCL 1 % OP SOLN
1.0000 [drp] | Freq: Two times a day (BID) | OPHTHALMIC | Status: DC
  Administered 2021-08-30: 1 [drp] via OPHTHALMIC
  Filled 2021-08-29: qty 2
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
## 2021-08-29 NOTE — Discharge Instructions (Signed)
Use your eye drops twice a day to help with pain. Wear sunglasses when eye is dilated if you in bright light.
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
## 2021-10-26 ENCOUNTER — Other Ambulatory Visit: Payer: Self-pay

## 2021-10-26 ENCOUNTER — Encounter: Payer: Self-pay | Admitting: Physician Assistant

## 2021-10-26 ENCOUNTER — Ambulatory Visit: Payer: Self-pay | Attending: Physician Assistant | Admitting: Physician Assistant

## 2021-10-26 VITALS — BP 132/91 | HR 67 | Ht 65.0 in | Wt 231.0 lb

## 2021-10-26 DIAGNOSIS — I10 Essential (primary) hypertension: Secondary | ICD-10-CM

## 2021-10-26 DIAGNOSIS — E785 Hyperlipidemia, unspecified: Secondary | ICD-10-CM

## 2021-10-26 DIAGNOSIS — E1165 Type 2 diabetes mellitus with hyperglycemia: Secondary | ICD-10-CM

## 2021-10-26 DIAGNOSIS — Z91199 Patient's noncompliance with other medical treatment and regimen due to unspecified reason: Secondary | ICD-10-CM

## 2021-10-26 LAB — POCT GLYCOSYLATED HEMOGLOBIN (HGB A1C): HbA1c, POC (controlled diabetic range): 13.2 % — AB (ref 0.0–7.0)

## 2021-10-26 LAB — GLUCOSE, POCT (MANUAL RESULT ENTRY): POC Glucose: 313 mg/dl — AB (ref 70–99)

## 2021-10-26 MED ORDER — SEMAGLUTIDE(0.25 OR 0.5MG/DOS) 2 MG/3ML ~~LOC~~ SOPN
0.5000 mg | PEN_INJECTOR | SUBCUTANEOUS | 1 refills | Status: DC
Start: 2021-10-26 — End: 2022-03-03
  Filled 2021-10-26 – 2022-03-03 (×2): qty 3, fill #0

## 2021-10-26 MED ORDER — INSULIN ASPART 100 UNIT/ML IJ SOLN
10.0000 [IU] | Freq: Once | INTRAMUSCULAR | Status: AC
  Administered 2021-10-26: 10 [IU] via SUBCUTANEOUS

## 2021-10-26 MED ORDER — TRULICITY 0.75 MG/0.5ML ~~LOC~~ SOAJ
0.7500 mg | SUBCUTANEOUS | 2 refills | Status: DC
  Filled 2021-10-26 – 2022-03-03 (×3): qty 2, 28d supply, fill #0

## 2021-10-26 MED ORDER — LISINOPRIL 5 MG PO TABS
2.5000 mg | ORAL_TABLET | Freq: Every day | ORAL | 3 refills | Status: DC
  Filled 2021-10-26: qty 45, 90d supply, fill #0

## 2021-10-26 MED ORDER — BASAGLAR KWIKPEN 100 UNIT/ML ~~LOC~~ SOPN
20.0000 [IU] | PEN_INJECTOR | Freq: Every day | SUBCUTANEOUS | 2 refills | Status: DC
  Filled 2021-10-26: qty 6, 30d supply, fill #0
  Filled 2022-03-03 (×2): qty 6, 30d supply, fill #1

## 2021-10-26 MED ORDER — "PEN NEEDLES 3/16"" 31G X 5 MM MISC"
1.0000 "application " | Freq: Four times a day (QID) | 3 refills | Status: AC
  Filled 2021-10-26: qty 100, 25d supply, fill #0

## 2021-10-26 MED ORDER — METFORMIN HCL 500 MG PO TABS
1000.0000 mg | ORAL_TABLET | Freq: Two times a day (BID) | ORAL | 2 refills | Status: DC
Start: 2021-10-26 — End: 2023-01-29
  Filled 2021-10-26: qty 120, 30d supply, fill #0

## 2021-10-26 MED ORDER — ATORVASTATIN CALCIUM 20 MG PO TABS
20.0000 mg | ORAL_TABLET | Freq: Every day | ORAL | 3 refills | Status: DC
Start: 2021-10-26 — End: 2023-01-29
  Filled 2021-10-26: qty 90, 90d supply, fill #0

## 2021-10-26 NOTE — Patient Instructions (Addendum)
Check blood sugars fasting and bedtime as you resume your medications and record and bring to next visit.  Drink 80-100 ounces water daily.  Work on low sugar diabetic diet.    Check blood pressure daily and record and bring to next visit

## 2021-10-26 NOTE — Progress Notes (Signed)
Patient ID: Jade Mullins, female   DOB: Oct 11, 1995, 26 y.o.   MRN: [id number]    Jade Mullins, is a 26 y.o. female  VQX:[id number]  [id number]:[id number]  DOB - 04-02-1995  Chief Complaint  Patient presents with   Medication Management   Diabetes       Subjective:   Jade Mullins is a 26 y.o. female here today for med RF.  She has not been keeping follow up.  She ran out of trulicity about 6 months ago.  She was taking basaglar 35 units daily up until 4-6 weeks ago as well as using some quick acting humalog intermittently she got from her mom.  She is requesting ozempic instead of trulicity.  She is able to borrow a BP cuff from her mom and is willing to check blood sugars and blood pressures.  She has not been taking any of the other meds.  Blood sugars have ranged from 79-437 over the last few months.  Does not follow diabetic diet.  She would like podiatry referral.  No problems updated.  ALLERGIES: Allergies  Allergen Reactions   Naproxen     Lips swelling    PAST MEDICAL HISTORY: Past Medical History:  Diagnosis Date   Diabetes mellitus type 2 in obese ([place])    Morbid obesity with BMI of 40.0-44.9, adult ([place])    Sciatica    Uncontrolled type 2 diabetes mellitus with hyperglycemia ([place]) 04/27/2020    MEDICATIONS AT HOME: Prior to Admission medications   Medication Sig Start Date End Date Taking? Authorizing Provider  Semaglutide,0.25 or 0.5MG/DOS, 2 MG/3ML SOPN Inject 0.5 mg into the skin once a week. 10/26/21  Yes Damari Hiltz, Dionne Bucy, PA-C  albuterol (VENTOLIN HFA) 108 (90 Base) MCG/ACT inhaler Inhale 2 puffs into the lungs every 4 (four) hours as needed for wheezing or shortness of breath. Patient not taking: Reported on 10/26/2021 01/04/21   Hans Eden, NP  atorvastatin (LIPITOR) 20 MG tablet TAKE 1 TABLET (20 MG TOTAL) BY MOUTH DAILY. 10/26/21 10/26/22  Argentina Donovan, PA-C  Blood Glucose Monitoring Suppl (TRUE METRIX METER) w/Device KIT Use to measure blood  sugar twice a day Patient not taking: Reported on 10/26/2021 01/14/20   Elsie Stain, MD  cyclobenzaprine (FLEXERIL) 5 MG tablet Take 1 tablet (5 mg total) by mouth at bedtime. Patient not taking: Reported on 10/26/2021 01/04/21   Hans Eden, NP  ibuprofen (ADVIL) 800 MG tablet Take 1 tablet (800 mg total) by mouth 3 (three) times daily. Patient not taking: Reported on 10/26/2021 01/04/21   Hans Eden, NP  Insulin Glargine [facility]) 100 UNIT/ML Inject 20 Units into the skin daily. 10/26/21 10/26/22  Argentina Donovan, PA-C  Insulin Pen Needle (PEN NEEDLES 3/16") 31G X 5 MM MISC 1 application  by Does not apply route 4 (four) times daily. 10/26/21   Argentina Donovan, PA-C  lisinopril (ZESTRIL) 5 MG tablet TAKE 0.5 TABLETS (2.5 MG TOTAL) BY MOUTH DAILY. 10/26/21 10/26/22  Argentina Donovan, PA-C  metFORMIN (GLUCOPHAGE) 500 MG tablet TAKE 2 TABLETS (1,000 MG TOTAL) BY MOUTH 2 (TWO) TIMES DAILY WITH A MEAL. 10/26/21 10/26/22  Argentina Donovan, PA-C  insulin lispro (HUMALOG KWIKPEN) 100 UNIT/ML KwikPen Inject 5 Units into the skin 3 (three) times daily. 01/29/20 02/26/20  Elsie Stain, MD    ROS: Neg HEENT Neg resp Neg cardiac Neg GI Neg GU Neg MS Neg psych Neg neuro  Objective:   Vitals:   10/26/21 0936  BP: (!) 132/91  Pulse: 67  SpO2: 100%  Weight: 231 lb (104.8 kg)  Height: 5' 5"  (1.651 m)   Exam General appearance : Awake, alert, not in any distress. Speech Clear. Not toxic looking HEENT: Atraumatic and Normocephalic Chest: Good air entry bilaterally, CTAB.  No rales/rhonchi/wheezing CVS: S1 S2 regular, no murmurs.  Extremities: B/L Lower Ext shows no edema, both legs are warm to touch Neurology: Awake alert, and oriented X 3, CN II-XII intact, Non focal Skin: No Rash  Data Review Lab Results  Component Value Date   HGBA1C 13.2 (A) 10/26/2021   HGBA1C 6.7 04/27/2020   HGBA1C 13.6 (A) 01/14/2020    Assessment & Plan   1. Uncontrolled type 2  diabetes mellitus with hyperglycemia ([place]) Uncontrolled-we can try ozempic per patient request (will change back to trulicity if too expensive. And resume basaglar at 20 units and resume metformin.Check blood sugars fasting and bedtime as you resume your medications and record and bring to next visit.  Drink 80-100 ounces water daily.  Work on low sugar diabetic diet.   - Glucose (CBG) - HgB A1c - Comprehensive metabolic panel - Lipid panel - CBC with Differential/Platelet - insulin aspart (novoLOG) injection 10 Units-to keep blood sugar from continuing to go up while getting meds filled - lisinopril (ZESTRIL) 5 MG tablet; TAKE 0.5 TABLETS (2.5 MG TOTAL) BY MOUTH DAILY.  Dispense: 45 tablet; Refill: 3 - metFORMIN (GLUCOPHAGE) 500 MG tablet; TAKE 2 TABLETS (1,000 MG TOTAL) BY MOUTH 2 (TWO) TIMES DAILY WITH A MEAL.  Dispense: 120 tablet; Refill: 2 - Insulin Glargine (BASAGLAR KWIKPEN) 100 UNIT/ML; Inject 20 Units into the skin daily.  Dispense: 15 mL; Refill: 2 - Semaglutide,0.25 or 0.5MG/DOS, 2 MG/3ML SOPN; Inject 0.5 mg into the skin once a week.  Dispense: 3 mL; Refill: 1 - Insulin Pen Needle (PEN NEEDLES 3/16") 31G X 5 MM MISC; 1 application  by Does not apply route 4 (four) times daily.  Dispense: 100 each; Refill: 3 - Ambulatory referral to Podiatry  2. Dyslipidemia - Comprehensive metabolic panel - Lipid panel - CBC with Differential/Platelet - atorvastatin (LIPITOR) 20 MG tablet; TAKE 1 TABLET (20 MG TOTAL) BY MOUTH DAILY.  Dispense: 90 tablet; Refill: 3  3. Hypertension, unspecified type Check blood pressure daily and record and bring to next visit - Comprehensive metabolic panel - Lipid panel - CBC with Differential/Platelet - lisinopril (ZESTRIL) 5 MG tablet; TAKE 0.5 TABLETS (2.5 MG TOTAL) BY MOUTH DAILY.  Dispense: 45 tablet; Refill: 3    Return for 3-4 weeks with Lurena Joiner for DM/htn and PCP in 3 months.  The patient was given clear instructions to go to ER or return to medical  center if symptoms don't improve, worsen or new problems develop. The patient verbalized understanding. The patient was told to call to get lab results if they haven't heard anything in the next week.      Freeman Caldron, PA-C [facility] [place], [place]   10/26/2021, 9:51 AM

## 2021-10-27 LAB — CBC WITH DIFFERENTIAL/PLATELET
Basophils Absolute: 0 10*3/uL (ref 0.0–0.2)
Basos: 1 %
EOS (ABSOLUTE): 0.2 10*3/uL (ref 0.0–0.4)
Eos: 3 %
Hematocrit: 35.5 % (ref 34.0–46.6)
Hemoglobin: 11.2 g/dL (ref 11.1–15.9)
Immature Grans (Abs): 0 10*3/uL (ref 0.0–0.1)
Immature Granulocytes: 0 %
Lymphocytes Absolute: 2.4 10*3/uL (ref 0.7–3.1)
Lymphs: 36 %
MCH: 26 pg — ABNORMAL LOW (ref 26.6–33.0)
MCHC: 31.5 g/dL (ref 31.5–35.7)
MCV: 82 fL (ref 79–97)
Monocytes Absolute: 0.7 10*3/uL (ref 0.1–0.9)
Monocytes: 11 %
Neutrophils Absolute: 3.3 10*3/uL (ref 1.4–7.0)
Neutrophils: 49 %
Platelets: 289 10*3/uL (ref 150–450)
RBC: 4.31 x10E6/uL (ref 3.77–5.28)
RDW: 14.9 % (ref 11.7–15.4)
WBC: 6.6 10*3/uL (ref 3.4–10.8)

## 2021-10-27 LAB — LIPID PANEL
Chol/HDL Ratio: 2.9 ratio (ref 0.0–4.4)
Cholesterol, Total: 191 mg/dL (ref 100–199)
HDL: 65 mg/dL (ref >39)
LDL Chol Calc (NIH): 115 mg/dL — ABNORMAL HIGH (ref 0–99)
Triglycerides: 57 mg/dL (ref 0–149)
VLDL Cholesterol Cal: 11 mg/dL (ref 5–40)

## 2021-10-27 LAB — COMPREHENSIVE METABOLIC PANEL
ALT: 11 IU/L (ref 0–32)
AST: 16 IU/L (ref 0–40)
Albumin/Globulin Ratio: 1.3 (ref 1.2–2.2)
Albumin: 4 g/dL (ref 4.0–5.0)
Alkaline Phosphatase: 93 IU/L (ref 44–121)
BUN/Creatinine Ratio: 11 (ref 9–23)
BUN: 7 mg/dL (ref 6–20)
Bilirubin Total: 0.3 mg/dL (ref 0.0–1.2)
CO2: 22 mmol/L (ref 20–29)
Calcium: 9.4 mg/dL (ref 8.7–10.2)
Chloride: 101 mmol/L (ref 96–106)
Creatinine, Ser: 0.66 mg/dL (ref 0.57–1.00)
Globulin, Total: 3.1 g/dL (ref 1.5–4.5)
Glucose: 308 mg/dL — ABNORMAL HIGH (ref 70–99)
Potassium: 4.6 mmol/L (ref 3.5–5.2)
Sodium: 139 mmol/L (ref 134–144)
Total Protein: 7.1 g/dL (ref 6.0–8.5)
eGFR: 123 mL/min/{1.73_m2} (ref >59)

## 2021-11-02 ENCOUNTER — Other Ambulatory Visit: Payer: Self-pay

## 2021-11-02 ENCOUNTER — Encounter: Payer: Self-pay | Admitting: *Deleted

## 2021-11-30 NOTE — Progress Notes (Deleted)
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 female who presents for diabetes evaluation, education, and management. PMH is significant for HTN, T2DM, obesity, and tobacco use.   Patient was referred and last seen by Primary Care Provider, PA Freeman Caldron, on 10/26/21. At that visit, she presented for med RF as she has been lost to follow up and not seen in clinic since 04/2020. A1c at the time was 13.2. She gave a range of 79-437 for her blood sugars. She was using her Basaglar until she ran out about 4-6 weeks ago and Humalog from her mom intermittently. Additionally, she ran out of her Trulicity 6 months ago and is requesting Ozempic. Basaglar was reinitiated at 20 units daily and Ozempic was started.   Today, patient arrives in *** good spirits and presents without *** any assistance. ***  Patient reports Diabetes was diagnosed in ***.   Family/Social History:  -Fhx: DM, HTN, CVD -Tobacco: *** -Alcohol: ***  Current diabetes medications include: Basaglar 20 units once daily, Ozempic 0.5mg  once weekly, metformin 1000mg  BID Current hypertension medications include: lisinopril 2.5mg  once daily  Current hyperlipidemia medications include: atorvastatin 20mg  once daily   Patient reports adherence to taking all medications as prescribed.  *** Patient denies adherence with medications, reports missing *** medications *** times per week, on average.  Insurance coverage: none  Patient {Actions; denies-reports:120008} hypoglycemic events.  Reported home fasting blood sugars: ***  Reported 2 hour post-meal/random blood sugars: ***.  Patient {Actions; denies-reports:120008} nocturia (nighttime urination).  Patient {Actions; denies-reports:120008} neuropathy (nerve pain). Patient {Actions; denies-reports:120008} visual changes. Patient {Actions; denies-reports:120008} self foot exams.   Patient reported dietary habits: Eats *** meals/day Breakfast: *** Lunch: *** Dinner:  *** Snacks: *** Drinks: ***  Within the past 12 months, did you worry whether your food would run out before you got money to buy more? {YES NO:22349} Within the past 12 months, did the food you bought run out, and you didn't have money to get more? {YES NO:22349} PHQ-9 Score: ***  Patient-reported exercise habits: ***   O:   ROS  Physical Exam  7 day average blood glucose: ***  *** CGM Download:  % Time CGM is active: ***% Average Glucose: *** mg/dL Glucose Management Indicator: ***  Glucose Variability: *** (goal <36%) Time in Goal:  - Time in range 70-180: ***% - Time above range: ***% - Time below range: ***% Observed patterns:   Lab Results  Component Value Date   HGBA1C 13.2 (A) 10/26/2021   There were no vitals filed for this visit.  Lipid Panel     Component Value Date/Time   CHOL 191 10/26/2021 1002   TRIG 57 10/26/2021 1002   HDL 65 10/26/2021 1002   CHOLHDL 2.9 10/26/2021 1002   CHOLHDL 3 04/03/2016 0832   VLDL 9.0 04/03/2016 0832   LDLCALC 115 (H) 10/26/2021 1002    Clinical Atherosclerotic Cardiovascular Disease (ASCVD): {YES/NO:21197} The ASCVD Risk score (Arnett DK, et al., 2019) failed to calculate for the following reasons:   The 2019 ASCVD risk score is only valid for ages 32 to 56   Patient is participating in a Managed Medicaid Plan:  {MM YES/NO:27447::"Yes"}   A/P: Diabetes longstanding *** currently ***. Patient is *** able to verbalize appropriate hypoglycemia management plan. Medication adherence appears ***. Control is suboptimal due to ***. -{Meds adjust:18428} basal insulin *** (insulin ***). Patient will continue to titrate 1 unit every *** days if fasting blood  sugar > 100mg /dl until fasting blood sugars reach goal or next visit.  -{Meds adjust:18428} rapid insulin *** (insulin ***) to ***.  -{Meds adjust:18428} GLP-1 *** (generic ***) to ***.  -{Meds adjust:18428} SGLT2-I *** (generic ***) to ***. Counseled on sick day  rules. -{Meds adjust:18428} metformin *** to ***.  -Patient educated on purpose, proper use, and potential adverse effects of ***.  -Extensively discussed pathophysiology of diabetes, recommended lifestyle interventions, dietary effects on blood sugar control.  -Counseled on s/sx of and management of hypoglycemia.  -Next A1c anticipated ***.   ASCVD risk - primary ***secondary prevention in patient with diabetes. Last LDL is *** not at goal of <70 *** mg/dL. ASCVD risk factors include *** and 10-year ASCVD risk score of ***. {Desc; low/moderate/high:110033} intensity statin indicated.  -{Meds adjust:18428} ***statin *** mg.   Hypertension longstanding *** currently ***. Blood pressure goal of <130/80 *** mmHg. Medication adherence ***. Blood pressure control is suboptimal due to ***. -***  Written patient instructions provided. Patient verbalized understanding of treatment plan.  Total time in face to face counseling *** minutes.    Follow-up:  Pharmacist ***. PCP clinic visit in ***.  Patient seen with ***.

## 2021-12-01 ENCOUNTER — Ambulatory Visit: Payer: Self-pay | Admitting: Pharmacist

## 2022-01-24 NOTE — Progress Notes (Deleted)
Subjective:    Patient ID: Jade Mullins, female    DOB: [birth year], 26 y.o.   MRN: [id number]  04/2021 26 y.o.F here to est pcp Had phone visit 03/2019 with Dr Chapman Fitch , no f/u since  At 03/2019 OV documentation as below   26 year old female, new to the practice seen status post hospitalization from 03/02/2019 through 03/05/2019 due to diabetic ketoacidosis with active problems of morbid obesity with BMI of 40-44.9 and diabetes mellitus type 2.  Per hospital notes, patient went to an urgent care and November of last year and had blood sugar around 500 and patient reported that she was diagnosed with prediabetes and prescribed metformin and subsequently lost 27 pounds with dietary changes.  She ran out of Metformin a week prior to presenting to the emergency department along with symptoms including vision changes, headaches, hypersomnolence, abdominal pain dry mouth and burning sensation on the tongue.  Hemoglobin A1c during admission was 15.3.  Potassium of 3.0 with last BMP done during hospitalization.  She reports that she is currently taking Lantus 30 units daily, short acting insulin as well as Metformin.  She reports no current issues with increased thirst, no blurred vision and no urinary frequency.  She denies any numbness or tingling in her feet.  She feels that her blood sugars are now well controlled.  She reports that she does have an appointment here on February 17 with clinical pharmacist.  She does need new refill of her Lantus.  She is checking her blood sugars before her evening meal and she states that the blood sugars are in the 160s.  She reports that she was told in the hospital to check her blood sugars before her evening meal.  Overall she feels well at this time.  On review of chart she does have history of sciatica but reports no current back pain.  She denies any chest pain or palpitations, no shortness of breath or cough, no headaches or dizziness, no abdominal pain-no nausea or  vomiting and no urinary frequency or dysuria.   Marland Kitchen Uncontrolled type 2 diabetes mellitus with hyperglycemia ([place]) 2. Type 2 diabetes mellitus without complication, with long-term current use of insulin ([place]) She reports improved control of her blood sugars and that she has an upcoming appointment with clinical pharmacist on February 17.  She will have blood work done at that time including lipid panel, urine microalbumin creatinine ratio and competence metabolic panel.  Refill provided of Lantus.  She is instructed to bring her glucometer and blood sugar diary to her upcoming visit.  Continue low carbohydrate diet, continue to be adherent with medication and continue monitoring of blood sugars. - Lipid Panel; Future - Comprehensive metabolic panel; Future - Insulin Glargine (LANTUS) 100 UNIT/ML Solostar Pen; Inject 30 Units into the skin daily.  Dispense: 15 mL; Refill: 3   3. Hypokalemia Patient with hypokalemia during hospitalization partially related to her high blood sugar with subsequent insulin therapy.  She will have repeat potassium as part of comprehensive metabolic panel at her upcoming visit. - Comprehensive metabolic panel; Future   The patient subsequently saw our clinical pharmacist later in February 2021 but has not been back since.  She struggled to get return visits with her primary care provider.  She then ran out of her insulin and all of her medications about 6 weeks ago.  On arrival blood sugar is 319 A1c is 13.  Patient does have a longstanding history of uncontrolled type 2 diabetes and had been  admitted with DKA earlier this year.  She is usually on insulin glargine 30 units daily and NovoLog 5 units 3 times daily  The patient states she has polyuria polydipsia and polyphagia, patient notes severe fatigue and weight loss as well.  Saw Luke 1X no f/u since  04/27/2020 This patient is seen in return follow-up for severe diabetes poorly controlled.  She is back on her insulin  Basaglar and Humalog and Lipitor.  She is lost 4 pounds in weight.  She is more compliant with her diet.  On arrival hemoglobin A1c is down to 6.7 her blood glucoses have been in the 120 250 range at home.  She is still smoking but down to 1 to 2 cigarellas daily.  She is in need of a Pap smear.  Overall her symptom complex is improved.  Note she is yet to receive the COVID vaccine.   10/2021 McClung Expand All Collapse All Patient ID: Jade Mullins, female   DOB: 09-28-1995, 26 y.o.   MRN: [id number]      Jade Mullins, is a 26 y.o. female   KGY:[id number]   WYO:[id number]   DOB - [birth year]    Chief Complaint Patient presents with  Medication Management  Diabetes        Subjective:   Jade Mullins is a 26 y.o. female here today for med RF.  She has not been keeping follow up.  She ran out of trulicity about 6 months ago.  She was taking basaglar 35 units daily up until 4-6 weeks ago as well as using some quick acting humalog intermittently she got from her mom.  She is requesting ozempic instead of trulicity.  She is able to borrow a BP cuff from her mom and is willing to check blood sugars and blood pressures.  She has not been taking any of the other meds.  Blood sugars have ranged from 79-437 over the last few months.  Does not follow diabetic diet.  She would like podiatry referral.   No problems updated.   ALLERGIES:  Allergies Allergen Reactions  Naproxen       Lips swelling     PAST MEDICAL HISTORY:  Past Medical History: Diagnosis Date  Diabetes mellitus type 2 in obese ([place])    Morbid obesity with BMI of 40.0-44.9, adult ([place])    Sciatica    Uncontrolled type 2 diabetes mellitus with hyperglycemia ([place]) 04/27/2020     MEDICATIONS AT HOME:  Prior to Admission medications  Medication Sig Start Date End Date Taking? Authorizing Provider Semaglutide,0.25 or 0.5MG/DOS, 2 MG/3ML SOPN Inject 0.5 mg into the skin once a week. 10/26/21   Yes McClung,  Dionne Bucy, PA-C albuterol (VENTOLIN HFA) 108 (90 Base) MCG/ACT inhaler Inhale 2 puffs into the lungs every 4 (four) hours as needed for wheezing or shortness of breath. Patient not taking: Reported on 10/26/2021 01/04/21     Hans Eden, NP atorvastatin (LIPITOR) 20 MG tablet TAKE 1 TABLET (20 MG TOTAL) BY MOUTH DAILY. 10/26/21 10/26/22   Argentina Donovan, PA-C Blood Glucose Monitoring Suppl (TRUE METRIX METER) w/Device KIT Use to measure blood sugar twice a day Patient not taking: Reported on 10/26/2021 01/14/20     Elsie Stain, MD cyclobenzaprine (FLEXERIL) 5 MG tablet Take 1 tablet (5 mg total) by mouth at bedtime. Patient not taking: Reported on 10/26/2021 01/04/21     Hans Eden, NP ibuprofen (ADVIL) 800 MG tablet Take 1 tablet (800 mg total) by mouth 3 (three) times  daily. Patient not taking: Reported on 10/26/2021 01/04/21     Hans Eden, NP Insulin Glargine [facility]) 100 UNIT/ML Inject 20 Units into the skin daily. 10/26/21 10/26/22   Argentina Donovan, PA-C Insulin Pen Needle (PEN NEEDLES 3/16") 31G X 5 MM MISC 1 application  by Does not apply route 4 (four) times daily. 10/26/21     Argentina Donovan, PA-C lisinopril (ZESTRIL) 5 MG tablet TAKE 0.5 TABLETS (2.5 MG TOTAL) BY MOUTH DAILY. 10/26/21 10/26/22   Argentina Donovan, PA-C metFORMIN (GLUCOPHAGE) 500 MG tablet TAKE 2 TABLETS (1,000 MG TOTAL) BY MOUTH 2 (TWO) TIMES DAILY WITH A MEAL. 10/26/21 10/26/22   Argentina Donovan, PA-C insulin lispro (HUMALOG KWIKPEN) 100 UNIT/ML KwikPen Inject 5 Units into the skin 3 (three) times daily. 01/29/20 02/26/20   Elsie Stain, MD     ROS: Neg HEENT Neg resp Neg cardiac Neg GI Neg GU Neg MS Neg psych Neg neuro    Objective:    Vitals:   10/26/21 0936 BP: (!) 132/91 Pulse: 67 SpO2: 100% Weight: 231 lb (104.8 kg) Height: _0  (1.651 m)   Exam General appearance : Awake, alert, not in any distress. Speech Clear. Not toxic looking HEENT: Atraumatic and  Normocephalic Chest: Good air entry bilaterally, CTAB.  No rales/rhonchi/wheezing CVS: S1 S2 regular, no murmurs.  Extremities: B/L Lower Ext shows no edema, both legs are warm to touch Neurology: Awake alert, and oriented X 3, CN II-XII intact, Non focal Skin: No Rash   Data Review  Recent Labs Lab Results Component Value Date   HGBA1C 13.2 (A) 10/26/2021   HGBA1C 6.7 04/27/2020   HGBA1C 13.6 (A) 01/14/2020       Assessment & Plan   1. Uncontrolled type 2 diabetes mellitus with hyperglycemia ([place]) Uncontrolled-we can try ozempic per patient request (will change back to trulicity if too expensive. And resume basaglar at 20 units and resume metformin.Check blood sugars fasting and bedtime as you resume your medications and record and bring to next visit.  Drink 80-100 ounces water daily.  Work on low sugar diabetic diet.   - Glucose (CBG) - HgB A1c - Comprehensive metabolic panel - Lipid panel - CBC with Differential/Platelet - insulin aspart (novoLOG) injection 10 Units-to keep blood sugar from continuing to go up while getting meds filled - lisinopril (ZESTRIL) 5 MG tablet; TAKE 0.5 TABLETS (2.5 MG TOTAL) BY MOUTH DAILY.  Dispense: 45 tablet; Refill: 3 - metFORMIN (GLUCOPHAGE) 500 MG tablet; TAKE 2 TABLETS (1,000 MG TOTAL) BY MOUTH 2 (TWO) TIMES DAILY WITH A MEAL.  Dispense: 120 tablet; Refill: 2 - Insulin Glargine (BASAGLAR KWIKPEN) 100 UNIT/ML; Inject 20 Units into the skin daily.  Dispense: 15 mL; Refill: 2 - Semaglutide,0.25 or 0.5MG/DOS, 2 MG/3ML SOPN; Inject 0.5 mg into the skin once a week.  Dispense: 3 mL; Refill: 1 - Insulin Pen Needle (PEN NEEDLES 3/16") 31G X 5 MM MISC; 1 application  by Does not apply route 4 (four) times daily.  Dispense: 100 each; Refill: 3 - Ambulatory referral to Podiatry   2. Dyslipidemia - Comprehensive metabolic panel - Lipid panel - CBC with Differential/Platelet - atorvastatin (LIPITOR) 20 MG tablet; TAKE 1 TABLET (20 MG TOTAL) BY MOUTH  DAILY.  Dispense: 90 tablet; Refill: 3   3. Hypertension, unspecified type Check blood pressure daily and record and bring to next visit - Comprehensive metabolic panel - Lipid panel - CBC with Differential/Platelet - lisinopril (ZESTRIL) 5 MG tablet; TAKE 0.5 TABLETS (  2.5 MG TOTAL) BY MOUTH DAILY.  Dispense: 45 tablet; Refill: 3   Never saw luke    Past Medical History:  Diagnosis Date   Diabetes mellitus type 2 in obese ([place])    Morbid obesity with BMI of 40.0-44.9, adult ([place])    Sciatica    Uncontrolled type 2 diabetes mellitus with hyperglycemia ([place]) 04/27/2020     Family History  Problem Relation Age of Onset   Arthritis Mother    Diabetes Mother    Diabetes Brother    Hypertension Maternal Aunt    Diabetes Maternal Aunt    Cancer Maternal Aunt        prostate   Hypertension Maternal Uncle    Diabetes Maternal Uncle    Arthritis Maternal Grandmother    Heart disease Maternal Grandmother    Hypertension Maternal Grandmother    Diabetes Maternal Grandmother      Social History   Socioeconomic History   Marital status: Single    Spouse name: Not on file   Number of children: Not on file   Years of education: Not on file   Highest education level: Not on file  Occupational History   Occupation: Freight forwarder at [employer]  Tobacco Use   Smoking status: Some Days    Packs/day: 0.00    Types: Cigars, Cigarettes   Smokeless tobacco: Never   Tobacco comments:    "like a month"  Substance and Sexual Activity   Alcohol use: Yes    Alcohol/week: 0.0 standard drinks of alcohol    Comment: none since 11/20   Drug use: Not Currently    Types: Marijuana    Comment: last use over 6 months ago   Sexual activity: Not Currently  Other Topics Concern   Not on file  Social History Narrative   Not on file   Social Determinants of Health   Financial Resource Strain: Not on file  Food Insecurity: Not on file  Transportation Needs: Not on file  Physical Activity:  Not on file  Stress: Not on file  Social Connections: Not on file  Intimate Partner Violence: Not on file     Allergies  Allergen Reactions   Naproxen     Lips swelling     Outpatient Medications Prior to Visit  Medication Sig Dispense Refill   albuterol (VENTOLIN HFA) 108 (90 Base) MCG/ACT inhaler Inhale 2 puffs into the lungs every 4 (four) hours as needed for wheezing or shortness of breath. (Patient not taking: Reported on 10/26/2021) 18 g 0   atorvastatin (LIPITOR) 20 MG tablet Take 1 tablet (20 mg total) by mouth daily. 90 tablet 3   Blood Glucose Monitoring Suppl (TRUE METRIX METER) w/Device KIT Use to measure blood sugar twice a day (Patient not taking: Reported on 10/26/2021) 1 kit 0   cyclobenzaprine (FLEXERIL) 5 MG tablet Take 1 tablet (5 mg total) by mouth at bedtime. (Patient not taking: Reported on 10/26/2021) 30 tablet 0   Dulaglutide (TRULICITY) 7.04 UG/8.9VQ SOPN Inject 0.75 mg into the skin once a week. 3 mL 2   ibuprofen (ADVIL) 800 MG tablet Take 1 tablet (800 mg total) by mouth 3 (three) times daily. (Patient not taking: Reported on 10/26/2021) 21 tablet 0   Insulin Glargine (BASAGLAR KWIKPEN) 100 UNIT/ML Inject 20 Units into the skin daily. 15 mL 2   Insulin Pen Needle (PEN NEEDLES 3/16") 31G X 5 MM MISC use as directed 4 (four) times daily. 100 each 3   lisinopril (ZESTRIL) 5 MG  tablet Take 0.5 tablets (2.5 mg total) by mouth daily. 45 tablet 3   metFORMIN (GLUCOPHAGE) 500 MG tablet Take 2 tablets (1,000 mg total) by mouth 2 (two) times daily with a meal. 120 tablet 2   Semaglutide,0.25 or 0.5MG/DOS, 2 MG/3ML SOPN Inject 0.5 mg into the skin once a week. 3 mL 1   No facility-administered medications prior to visit.      Review of Systems  Constitutional:  Negative for fatigue.  HENT: Negative.  Negative for dental problem.   Eyes:  Negative for visual disturbance.  Respiratory:  Negative for cough and shortness of breath.   Cardiovascular:  Negative for chest  pain, palpitations and leg swelling.  Gastrointestinal:  Negative for abdominal pain, diarrhea, nausea and vomiting.  Endocrine: Negative for cold intolerance, heat intolerance, polydipsia, polyphagia and polyuria.  Genitourinary: Negative.   Musculoskeletal: Negative.   Skin: Negative.   Allergic/Immunologic: Negative.   Neurological:  Negative for dizziness, tremors, seizures, syncope, speech difficulty, weakness, light-headedness, numbness and headaches.  Hematological: Negative.   Psychiatric/Behavioral: Negative.         Objective:   Physical Exam There were no vitals filed for this visit.   Gen: Pleasant, obese, in no distress,  normal affect  ENT: No lesions,  mouth clear,  oropharynx clear, no postnasal drip  Neck: No JVD, no TMG, no carotid bruits  Lungs: No use of accessory muscles, no dullness to percussion, clear without rales or rhonchi  Cardiovascular: RRR, heart sounds normal, no murmur or gallops, no peripheral edema  Abdomen: soft and NT, no HSM,  BS normal  Musculoskeletal: No deformities, no cyanosis or clubbing  Neuro: alert, non focal  Skin: Warm, no lesions or rashes      Assessment & Plan:  I personally reviewed all images and lab data in the [facility] system as well as any outside material available during this office visit and agree with the  radiology impressions.   No problem-specific Assessment & Plan notes found for this encounter.   There are no diagnoses linked to this encounter.  Refer to gynecology for Pap smear

## 2022-01-25 ENCOUNTER — Ambulatory Visit: Payer: Self-pay | Admitting: Critical Care Medicine

## 2022-03-03 ENCOUNTER — Other Ambulatory Visit: Payer: Self-pay | Admitting: Pharmacist

## 2022-03-03 ENCOUNTER — Other Ambulatory Visit: Payer: Self-pay

## 2022-03-06 ENCOUNTER — Other Ambulatory Visit: Payer: Self-pay

## 2022-03-07 ENCOUNTER — Other Ambulatory Visit: Payer: Self-pay

## 2022-04-11 ENCOUNTER — Telehealth: Payer: Self-pay | Admitting: Critical Care Medicine

## 2022-04-11 NOTE — Telephone Encounter (Signed)
We need to contact this patient to see how her BP has been going and to make f/u appt as one does not currently exist

## 2022-04-11 NOTE — Telephone Encounter (Signed)
Called patient and left voicemail.

## 2022-06-12 ENCOUNTER — Telehealth: Payer: Self-pay | Admitting: Pharmacist

## 2022-06-12 NOTE — Progress Notes (Signed)
Patient attempted to be outreached by Natalie Elchert, PharmD Candidate on 06/12/2022 to discuss hypertension. Left voicemail for patient to return our call at their convenience at [phone].   Natalie Elchert, PharmD Candidate   Catie T. Daelen Belvedere, PharmD, BCACP, CPP [facility] [phone]   

## 2022-11-18 ENCOUNTER — Other Ambulatory Visit: Payer: Self-pay

## 2022-11-18 ENCOUNTER — Encounter (HOSPITAL_COMMUNITY): Payer: Self-pay

## 2022-11-18 ENCOUNTER — Emergency Department (HOSPITAL_COMMUNITY): Payer: Self-pay

## 2022-11-18 ENCOUNTER — Emergency Department (HOSPITAL_COMMUNITY)
Admission: EM | Admit: 2022-11-18 | Discharge: 2022-11-18 | Disposition: A | Discharge status: Home / Self Care | Payer: Self-pay | Attending: Emergency Medicine | Admitting: Emergency Medicine

## 2022-11-18 DIAGNOSIS — W230XXA Caught, crushed, jammed, or pinched between moving objects, initial encounter: Secondary | ICD-10-CM | POA: Insufficient documentation

## 2022-11-18 DIAGNOSIS — S6991XA Unspecified injury of right wrist, hand and finger(s), initial encounter: Secondary | ICD-10-CM | POA: Insufficient documentation

## 2022-11-18 DIAGNOSIS — M7989 Other specified soft tissue disorders: Secondary | ICD-10-CM | POA: Insufficient documentation

## 2022-11-18 NOTE — ED Triage Notes (Signed)
Pt states she slammed her right ring finger in a door two weeks ago and then she bumped it again today and now it is painful and swollen.

## 2022-11-18 NOTE — Discharge Instructions (Signed)
Wear the splint for your finger for comfort  Return to the emergency department if you develop any concerning symptoms

## 2022-11-18 NOTE — ED Provider Notes (Incomplete)
[facility] EMERGENCY DEPARTMENT AT [facility] Provider Note   CSN: [id number] Arrival date & time: 11/18/22  1619     History {Add pertinent medical, surgical, social history, OB history to HPI:1} Chief Complaint  Patient presents with   Finger Injury    Jade Mullins is a 27 y.o. female.  27 year old right-handed female presents emergency department with right hand pain.  Patient reports that 2 weeks ago she jammed her right ring finger in a door.  Says that she did the same thing again today and has had swelling around her PIP and is making it difficult to bend her finger so she decided to come into the emergency department for evaluation.  Not on blood thinners.  Does not take any medicine for it yet.       Home Medications Prior to Admission medications   Medication Sig Start Date End Date Taking? Authorizing Provider  albuterol (VENTOLIN HFA) 108 (90 Base) MCG/ACT inhaler Inhale 2 puffs into the lungs every 4 (four) hours as needed for wheezing or shortness of breath. Patient not taking: Reported on 10/26/2021 01/04/21   Valinda Hoar, NP  atorvastatin (LIPITOR) 20 MG tablet Take 1 tablet (20 mg total) by mouth daily. 10/26/21   Anders Simmonds, PA-C  Blood Glucose Monitoring Suppl (TRUE METRIX METER) w/Device KIT Use to measure blood sugar twice a day Patient not taking: Reported on 10/26/2021 01/14/20   Storm Frisk, MD  cyclobenzaprine (FLEXERIL) 5 MG tablet Take 1 tablet (5 mg total) by mouth at bedtime. Patient not taking: Reported on 10/26/2021 01/04/21   Valinda Hoar, NP  Dulaglutide (TRULICITY) 0.75 MG/0.5ML SOPN Inject 0.75 mg into the skin once a week. 10/26/21   Anders Simmonds, PA-C  ibuprofen (ADVIL) 800 MG tablet Take 1 tablet (800 mg total) by mouth 3 (three) times daily. Patient not taking: Reported on 10/26/2021 01/04/21   Valinda Hoar, NP  Insulin Glargine [facility]) 100 UNIT/ML Inject 20 Units into the skin daily.  10/26/21 10/26/22  Anders Simmonds, PA-C  Insulin Pen Needle (PEN NEEDLES 3/16") 31G X 5 MM MISC use as directed 4 (four) times daily. 10/26/21   Anders Simmonds, PA-C  lisinopril (ZESTRIL) 5 MG tablet Take 0.5 tablets (2.5 mg total) by mouth daily. 10/26/21   Anders Simmonds, PA-C  metFORMIN (GLUCOPHAGE) 500 MG tablet Take 2 tablets (1,000 mg total) by mouth 2 (two) times daily with a meal. 10/26/21   McClung, Marzella Schlein, PA-C  insulin lispro (HUMALOG KWIKPEN) 100 UNIT/ML KwikPen Inject 5 Units into the skin 3 (three) times daily. 01/29/20 02/26/20  Storm Frisk, MD      Allergies    Naproxen    Review of Systems   Review of Systems  Physical Exam Updated Vital Signs BP (!) 149/105 (BP Location: Right Arm)   Pulse (!) 108   Temp 98.6 F (37 C) (Oral)   Resp 19   Ht 5\' 5"  (1.651 m)   Wt 98 kg   SpO2 98%   BMI 35.94 kg/m  Physical Exam Musculoskeletal:     Comments: Mild amount of swelling around right ring finger PIP.  Good cap refill.  Full range of motion.  No obvious deformities.  Intact sensation to light touch for the remainder of the finger.  No tenderness to palpation of MCP or DIP.     ED Results / Procedures / Treatments   Labs (all labs ordered are listed, but only abnormal  results are displayed) Labs Reviewed - No data to display  EKG None  Radiology No results found.  Procedures Procedures  {Document cardiac monitor, telemetry assessment procedure when appropriate:1}  Medications Ordered in ED Medications - No data to display  ED Course/ Medical Decision Making/ A&P   {   Click here for ABCD2, HEART and other calculatorsREFRESH Note before signing :1}                              Medical Decision Making Amount and/or Complexity of Data Reviewed Radiology: ordered.   ***  {Document critical care time when appropriate:1} {Document review of labs and clinical decision tools ie heart score, Chads2Vasc2 etc:1}  {Document your independent review  of radiology images, and any outside records:1} {Document your discussion with family members, caretakers, and with consultants:1} {Document social determinants of health affecting pt's care:1} {Document your decision making why or why not admission, treatments were needed:1} Final Clinical Impression(s) / ED Diagnoses Final diagnoses:  None    Rx / DC Orders ED Discharge Orders     None

## 2023-01-27 ENCOUNTER — Emergency Department (HOSPITAL_COMMUNITY): Payer: Self-pay

## 2023-01-27 ENCOUNTER — Emergency Department (HOSPITAL_COMMUNITY)
Admission: EM | Admit: 2023-01-27 | Discharge: 2023-01-27 | Disposition: A | Discharge status: Home / Self Care | Payer: Self-pay | Attending: Emergency Medicine | Admitting: Emergency Medicine

## 2023-01-27 ENCOUNTER — Encounter (HOSPITAL_COMMUNITY): Payer: Self-pay

## 2023-01-27 ENCOUNTER — Other Ambulatory Visit: Payer: Self-pay

## 2023-01-27 DIAGNOSIS — R739 Hyperglycemia, unspecified: Secondary | ICD-10-CM | POA: Insufficient documentation

## 2023-01-27 DIAGNOSIS — X58XXXA Exposure to other specified factors, initial encounter: Secondary | ICD-10-CM | POA: Diagnosis not present

## 2023-01-27 DIAGNOSIS — S61412A Laceration without foreign body of left hand, initial encounter: Secondary | ICD-10-CM | POA: Diagnosis present

## 2023-01-27 DIAGNOSIS — Z1152 Encounter for screening for COVID-19: Secondary | ICD-10-CM | POA: Insufficient documentation

## 2023-01-27 DIAGNOSIS — Z794 Long term (current) use of insulin: Secondary | ICD-10-CM | POA: Diagnosis not present

## 2023-01-27 DIAGNOSIS — J069 Acute upper respiratory infection, unspecified: Secondary | ICD-10-CM | POA: Diagnosis not present

## 2023-01-27 DIAGNOSIS — L089 Local infection of the skin and subcutaneous tissue, unspecified: Secondary | ICD-10-CM

## 2023-01-27 LAB — CBC WITH DIFFERENTIAL/PLATELET
Abs Immature Granulocytes: 0.02 10*3/uL (ref 0.00–0.07)
Basophils Absolute: 0 10*3/uL (ref 0.0–0.1)
Basophils Relative: 1 %
Eosinophils Absolute: 0.2 10*3/uL (ref 0.0–0.5)
Eosinophils Relative: 3 %
HCT: 39.5 % (ref 36.0–46.0)
Hemoglobin: 12.8 g/dL (ref 12.0–15.0)
Immature Granulocytes: 0 %
Lymphocytes Relative: 29 %
Lymphs Abs: 1.6 10*3/uL (ref 0.7–4.0)
MCH: 25.9 pg — ABNORMAL LOW (ref 26.0–34.0)
MCHC: 32.4 g/dL (ref 30.0–36.0)
MCV: 80 fL (ref 80.0–100.0)
Monocytes Absolute: 0.7 10*3/uL (ref 0.1–1.0)
Monocytes Relative: 13 %
Neutro Abs: 3 10*3/uL (ref 1.7–7.7)
Neutrophils Relative %: 54 %
Platelets: 216 10*3/uL (ref 150–400)
RBC: 4.94 MIL/uL (ref 3.87–5.11)
RDW: 16.9 % — ABNORMAL HIGH (ref 11.5–15.5)
WBC: 5.4 10*3/uL (ref 4.0–10.5)
nRBC: 0 % (ref 0.0–0.2)

## 2023-01-27 LAB — BASIC METABOLIC PANEL
Anion gap: 11 (ref 5–15)
BUN: 9 mg/dL (ref 6–20)
CO2: 23 mmol/L (ref 22–32)
Calcium: 9.3 mg/dL (ref 8.9–10.3)
Chloride: 102 mmol/L (ref 98–111)
Creatinine, Ser: 0.64 mg/dL (ref 0.44–1.00)
GFR, Estimated: >60 mL/min (ref >60)
Glucose, Bld: 325 mg/dL — ABNORMAL HIGH (ref 70–99)
Potassium: 4 mmol/L (ref 3.5–5.1)
Sodium: 136 mmol/L (ref 135–145)

## 2023-01-27 LAB — RESP PANEL BY RT-PCR (RSV, FLU A&B, COVID)  RVPGX2
Influenza A by PCR: NEGATIVE
Influenza B by PCR: NEGATIVE
Resp Syncytial Virus by PCR: NEGATIVE
SARS Coronavirus 2 by RT PCR: NEGATIVE

## 2023-01-27 LAB — TROPONIN I (HIGH SENSITIVITY): Troponin I (High Sensitivity): 3 ng/L (ref <18)

## 2023-01-27 LAB — CBG MONITORING, ED: Glucose-Capillary: 315 mg/dL — ABNORMAL HIGH (ref 70–99)

## 2023-01-27 MED ORDER — DOXYCYCLINE HYCLATE 100 MG PO CAPS
100.0000 mg | ORAL_CAPSULE | Freq: Two times a day (BID) | ORAL | 0 refills | Status: DC
Start: 2023-01-27 — End: 2023-01-27

## 2023-01-27 MED ORDER — DOXYCYCLINE HYCLATE 100 MG PO TABS
100.0000 mg | ORAL_TABLET | Freq: Once | ORAL | Status: AC
  Administered 2023-01-27: 100 mg via ORAL
  Filled 2023-01-27: qty 1

## 2023-01-27 MED ORDER — DOXYCYCLINE HYCLATE 100 MG PO CAPS: 100.0000 mg | ORAL_CAPSULE | Freq: Two times a day (BID) | ORAL | 0 refills | Status: DC

## 2023-01-27 NOTE — ED Provider Notes (Signed)
[place] EMERGENCY DEPARTMENT AT [facility] Provider Note   CSN: [id number] Arrival date & time: 01/27/23  1737    History  Chief Complaint  Patient presents with   Muscle Pain    Jade Mullins is a 27 y.o. female here for evaluation of feeling unwell.  Over the last week has had cough, congestion, dry throat, generalized bodyaches, decreased appetite.  No fever.  Unsure of sick contacts.  No history of PE or DVT.  Does get short of breath with coughing.  Also notes she has a wound to her left hand after laceration 1 week ago.  Her last tetanus is less than 1 year ago.  She did not seek treatment at that time.  She has noticed some redness and swelling surrounding the area.  No drainage.  HPI     Home Medications Prior to Admission medications   Medication Sig Start Date End Date Taking? Authorizing Provider  atorvastatin (LIPITOR) 20 MG tablet Take 1 tablet (20 mg total) by mouth daily. 10/26/21   Anders Simmonds, PA-C  Blood Glucose Monitoring Suppl (TRUE METRIX METER) w/Device KIT Use to measure blood sugar twice a day Patient not taking: Reported on 10/26/2021 01/14/20   Storm Frisk, MD  doxycycline (VIBRAMYCIN) 100 MG capsule Take 1 capsule (100 mg total) by mouth 2 (two) times daily. 01/27/23   Kenta Laster A, PA-C  Dulaglutide (TRULICITY) 0.75 MG/0.5ML SOPN Inject 0.75 mg into the skin once a week. 10/26/21   Anders Simmonds, PA-C  Insulin Glargine (BASAGLAR KWIKPEN) 100 UNIT/ML Inject 20 Units into the skin daily. 10/26/21 10/26/22  Anders Simmonds, PA-C  Insulin Pen Needle (PEN NEEDLES 3/16") 31G X 5 MM MISC use as directed 4 (four) times daily. 10/26/21   Anders Simmonds, PA-C  lisinopril (ZESTRIL) 5 MG tablet Take 0.5 tablets (2.5 mg total) by mouth daily. 10/26/21   Anders Simmonds, PA-C  metFORMIN (GLUCOPHAGE) 500 MG tablet Take 2 tablets (1,000 mg total) by mouth 2 (two) times daily with a meal. 10/26/21   McClung, Marzella Schlein, PA-C  insulin  lispro (HUMALOG KWIKPEN) 100 UNIT/ML KwikPen Inject 5 Units into the skin 3 (three) times daily. 01/29/20 02/26/20  Storm Frisk, MD      Allergies    Naproxen    Review of Systems   Review of Systems  Constitutional:  Positive for activity change, appetite change and chills.  HENT:  Positive for congestion and rhinorrhea. Negative for sore throat, tinnitus, trouble swallowing and voice change.   Respiratory:  Positive for cough and shortness of breath.   Cardiovascular: Negative.   Gastrointestinal: Negative.   Genitourinary: Negative.   Musculoskeletal: Negative.   Skin:  Positive for wound.  Neurological: Negative.   All other systems reviewed and are negative.   Physical Exam Updated Vital Signs BP (!) 144/100 (BP Location: Right Arm)   Pulse (!) 107   Temp 98.3 F (36.8 C) (Oral)   Resp 16   Ht 5\' 5"  (1.651 m)   Wt 98 kg   SpO2 99%   BMI 35.94 kg/m  Physical Exam Vitals and nursing note reviewed.  Constitutional:      General: She is not in acute distress.    Appearance: She is well-developed. She is not ill-appearing, toxic-appearing or diaphoretic.  HENT:     Head: Normocephalic and atraumatic.     Nose: Congestion and rhinorrhea present.     Mouth/Throat:     Mouth: Mucous membranes  are moist.     Comments: Posterior pharynx clear, uvula midline, no tonsillar edema or exudate. Eyes:     Pupils: Pupils are equal, round, and reactive to light.  Neck:     Comments: No neck stiffness or neck rigidity no meningismus Cardiovascular:     Rate and Rhythm: Normal rate.     Pulses: Normal pulses.     Heart sounds: Normal heart sounds.  Pulmonary:     Effort: Pulmonary effort is normal. No respiratory distress.     Breath sounds: Normal breath sounds.     Comments: Clear Bilaterally, speaks in full sentences without difficulty Abdominal:     General: Bowel sounds are normal. There is no distension.     Palpations: Abdomen is soft.     Tenderness: There is no  abdominal tenderness. There is no guarding or rebound.  Musculoskeletal:        General: Normal range of motion.     Cervical back: Normal range of motion and neck supple.     Comments: No bony tenderness, compartments soft, no lower extremity edema  Skin:    General: Skin is warm and dry.     Capillary Refill: Capillary refill takes less than 2 seconds.     Comments: 2cm V-shaped gaping laceration, granulated tissue at center, no exposed bone, obvious foreign object.  Mild surrounding erythema with warmth, no fluctuance or induration  Neurological:     General: No focal deficit present.     Mental Status: She is alert.     Cranial Nerves: No cranial nerve deficit.     Sensory: No sensory deficit.     Motor: No weakness.     Gait: Gait normal.  Psychiatric:        Mood and Affect: Mood normal.     ED Results / Procedures / Treatments   Labs (all labs ordered are listed, but only abnormal results are displayed) Labs Reviewed  CBC WITH DIFFERENTIAL/PLATELET - Abnormal; Notable for the following components:      Result Value   MCH 25.9 (*)    RDW 16.9 (*)    All other components within normal limits  BASIC METABOLIC PANEL - Abnormal; Notable for the following components:   Glucose, Bld 325 (*)    All other components within normal limits  CBG MONITORING, ED - Abnormal; Notable for the following components:   Glucose-Capillary 315 (*)    All other components within normal limits  RESP PANEL BY RT-PCR (RSV, FLU A&B, COVID)  [id number]  TROPONIN I (HIGH SENSITIVITY)    EKG EKG Interpretation Date/Time:  Saturday January 27 2023 18:10:16 EST Ventricular Rate:  101 PR Interval:  126 QRS Duration:  82 QT Interval:  362 QTC Calculation: 469 R Axis:   1  Text Interpretation: Sinus tachycardia Minimal voltage criteria for LVH, may be normal variant ( R in aVL ) Cannot rule out Anterior infarct , age undetermined Abnormal ECG No previous ECGs available Confirmed by Kristine Royal  [phone]) on 01/27/2023 8:42:35 PM  Radiology DG Hand Complete Left Result Date: 01/27/2023 CLINICAL DATA:  Hand wound EXAM: LEFT HAND - COMPLETE 3+ VIEW COMPARISON:  None Available. FINDINGS: There is no evidence of fracture or dislocation. There is no evidence of arthropathy or other focal bone abnormality. Soft tissues are unremarkable. IMPRESSION: Negative. Electronically Signed   By: Jasmine Pang M.D.   On: 01/27/2023 21:11   DG Chest 2 View Result Date: 01/27/2023 CLINICAL DATA:  Shortness of breath with  chest pain EXAM: CHEST - 2 VIEW COMPARISON:  Chest x-ray 11/16/2019 FINDINGS: The heart size and mediastinal contours are within normal limits. Both lungs are clear. The visualized skeletal structures are unremarkable. IMPRESSION: No active cardiopulmonary disease. Electronically Signed   By: Darliss Cheney M.D.   On: 01/27/2023 19:30   Procedures Procedures    Medications Ordered in ED Medications  doxycycline (VIBRA-TABS) tablet 100 mg (100 mg Oral Given 01/27/23 2116)   ED Course/ Medical Decision Making/ A&P   27 year old here for evaluation of URI symptoms. 1 week of myalgias, dry throat, shortness of breath, cough, congestion and rhinorrhea. On arrival she is afebrile, nonseptic, not ill-appearing.  Her heart and lungs are clear.  Her posterior oropharynx is clear.  She does have some congestion and rhinorrhea.  She has no neck stiffness or neck rigidity, have low suspicion for meningitis.  No clinical evidence of VTE on exam.  She also notes a wound from a laceration which occurred 1 week ago.  Her tetanus is up-to-date.  No bony tenderness plan on labs, imaging and reassess  Labs and imaging personally viewed and interpreted:  EKG without ischemic changes Chest x-ray without cardiomegaly, pulm edema, pneumothorax, infiltrates X-ray left hand without fracture, retained foreign object Viral panel negative CBC without leukocytosis  metabolic panel glucose 325, normal  anion Trop 3  Discussed results with patient.  Will start on Doxy for her left hand wound.  With regards to her her URI symptoms are likely viral in nature.  Will have her follow-up outpatient.  Low suspicion for acute ACS, PE, dissection, DKA, HHS, open fracture, abscess, VTE  The patient has been appropriately medically screened and/or stabilized in the ED. I have low suspicion for any other emergent medical condition which would require further screening, evaluation or treatment in the ED or require inpatient management.  Patient is hemodynamically stable and in no acute distress.  Patient able to ambulate in department prior to ED.  Evaluation does not show acute pathology that would require ongoing or additional emergent interventions while in the emergency department or further inpatient treatment.  I have discussed the diagnosis with the patient and answered all questions.  Pain is been managed while in the emergency department and patient has no further complaints prior to discharge.  Patient is comfortable with plan discussed in room and is stable for discharge at this time.  I have discussed strict return precautions for returning to the emergency department.  Patient was encouraged to follow-up with PCP/specialist refer to at discharge.                                  Medical Decision Making Amount and/or Complexity of Data Reviewed External Data Reviewed: labs, radiology, ECG and notes. Labs: ordered. Decision-making details documented in ED Course. Radiology: ordered and independent interpretation performed. Decision-making details documented in ED Course. ECG/medicine tests: ordered and independent interpretation performed. Decision-making details documented in ED Course.  Risk OTC drugs. Prescription drug management. Parenteral controlled substances. Decision regarding hospitalization. Diagnosis or treatment significantly limited by social determinants of  health.           Final Clinical Impression(s) / ED Diagnoses Final diagnoses:  Viral URI with cough  Laceration of left hand with infection, initial encounter  Hyperglycemia    Rx / DC Orders ED Discharge Orders          Ordered    doxycycline (  VIBRAMYCIN) 100 MG capsule  2 times daily,   Status:  Discontinued        01/27/23 2136    doxycycline (VIBRAMYCIN) 100 MG capsule  2 times daily        01/27/23 2136              Shaylie Eklund A, PA-C 01/27/23 2208    Wynetta Fines, MD 01/27/23 2332

## 2023-01-27 NOTE — Discharge Instructions (Addendum)
It was a pleasure taking care of you here in the emergency department  We have started you on antibiotics.  Take as prescribed  Make sure to follow-up with your PCP for elevated blood sugar  Return for new or worsening symptoms

## 2023-01-27 NOTE — ED Triage Notes (Signed)
Patient reports for a week and a half has been feeling bad with generalized body aches, dry throat, sob and cough numeorus complaints.  Then wants her hand wound looked out.

## 2023-01-29 ENCOUNTER — Ambulatory Visit: Payer: BC Managed Care – PPO | Attending: Internal Medicine | Admitting: Internal Medicine

## 2023-01-29 ENCOUNTER — Encounter: Payer: Self-pay | Admitting: Internal Medicine

## 2023-01-29 ENCOUNTER — Encounter: Payer: Self-pay | Admitting: Critical Care Medicine

## 2023-01-29 ENCOUNTER — Ambulatory Visit: Payer: Self-pay

## 2023-01-29 ENCOUNTER — Other Ambulatory Visit: Payer: Self-pay

## 2023-01-29 VITALS — BP 114/82 | HR 90 | Temp 98.4°F | Ht 65.0 in | Wt 211.0 lb

## 2023-01-29 DIAGNOSIS — J069 Acute upper respiratory infection, unspecified: Secondary | ICD-10-CM

## 2023-01-29 DIAGNOSIS — Z7985 Long-term (current) use of injectable non-insulin antidiabetic drugs: Secondary | ICD-10-CM

## 2023-01-29 DIAGNOSIS — E1169 Type 2 diabetes mellitus with other specified complication: Secondary | ICD-10-CM | POA: Diagnosis not present

## 2023-01-29 DIAGNOSIS — E1159 Type 2 diabetes mellitus with other circulatory complications: Secondary | ICD-10-CM

## 2023-01-29 DIAGNOSIS — E1165 Type 2 diabetes mellitus with hyperglycemia: Secondary | ICD-10-CM

## 2023-01-29 DIAGNOSIS — E785 Hyperlipidemia, unspecified: Secondary | ICD-10-CM

## 2023-01-29 DIAGNOSIS — S65312D Laceration of deep palmar arch of left hand, subsequent encounter: Secondary | ICD-10-CM

## 2023-01-29 DIAGNOSIS — Z794 Long term (current) use of insulin: Secondary | ICD-10-CM

## 2023-01-29 DIAGNOSIS — R0602 Shortness of breath: Secondary | ICD-10-CM

## 2023-01-29 DIAGNOSIS — Z7984 Long term (current) use of oral hypoglycemic drugs: Secondary | ICD-10-CM

## 2023-01-29 DIAGNOSIS — I152 Hypertension secondary to endocrine disorders: Secondary | ICD-10-CM

## 2023-01-29 DIAGNOSIS — F17290 Nicotine dependence, other tobacco product, uncomplicated: Secondary | ICD-10-CM

## 2023-01-29 DIAGNOSIS — E119 Type 2 diabetes mellitus without complications: Secondary | ICD-10-CM

## 2023-01-29 MED ORDER — LISINOPRIL 5 MG PO TABS
2.5000 mg | ORAL_TABLET | Freq: Every day | ORAL | 3 refills | Status: DC
Start: 2023-01-29 — End: 2023-04-13
  Filled 2023-01-29: qty 45, 90d supply, fill #0

## 2023-01-29 MED ORDER — METFORMIN HCL 500 MG PO TABS
1000.0000 mg | ORAL_TABLET | Freq: Two times a day (BID) | ORAL | 2 refills | Status: AC
  Filled 2023-01-29: qty 120, 30d supply, fill #0

## 2023-01-29 MED ORDER — TRULICITY 0.75 MG/0.5ML ~~LOC~~ SOAJ
0.7500 mg | SUBCUTANEOUS | 2 refills | Status: DC
  Filled 2023-01-29: qty 2, 28d supply, fill #0

## 2023-01-29 MED ORDER — ATORVASTATIN CALCIUM 20 MG PO TABS
20.0000 mg | ORAL_TABLET | Freq: Every day | ORAL | 3 refills | Status: DC
  Filled 2023-01-29: qty 90, 90d supply, fill #0

## 2023-01-29 MED ORDER — ATORVASTATIN CALCIUM 20 MG PO TABS: 20.0000 mg | ORAL_TABLET | Freq: Every day | ORAL | 3 refills | Status: AC

## 2023-01-29 MED ORDER — ALBUTEROL SULFATE HFA 108 (90 BASE) MCG/ACT IN AERS
2.0000 | INHALATION_SPRAY | Freq: Four times a day (QID) | RESPIRATORY_TRACT | 2 refills | Status: AC | PRN
  Filled 2023-01-29: qty 6.7, 25d supply, fill #0

## 2023-01-29 NOTE — Telephone Encounter (Signed)
      Chief Complaint: Seen in ED 01/27/23 with URI, started on antibiotic. "I still feel bad all over and get SOB with exertion." Symptoms: Above Frequency: Last week Pertinent Negatives: Patient denies fever Disposition: [] ED /[] Urgent Care (no appt availability in office) / [x] Appointment(In office/virtual)/ []  [facility]/ [] Home Care/ [] Refused Recommended Disposition /[] [facility]/ []  Follow-up with PCP Additional Notes: Agrees with appointment.  Reason for Disposition  [1] MILD difficulty breathing (e.g., minimal/no SOB at rest, SOB with walking, pulse <100) AND [2] NEW-onset or WORSE than normal  Answer Assessment - Initial Assessment Questions 1. RESPIRATORY STATUS: "Describe your breathing?" (e.g., wheezing, shortness of breath, unable to speak, severe coughing)      SOB 2. ONSET: "When did this breathing problem begin?"      Last week 3. PATTERN "Does the difficult breathing come and go, or has it been constant since it started?"      Comes and goes 4. SEVERITY: "How bad is your breathing?" (e.g., mild, moderate, severe)    - MILD: No SOB at rest, mild SOB with walking, speaks normally in sentences, can lie down, no retractions, pulse < 100.    - MODERATE: SOB at rest, SOB with minimal exertion and prefers to sit, cannot lie down flat, speaks in phrases, mild retractions, audible wheezing, pulse 100-120.    - SEVERE: Very SOB at rest, speaks in single words, struggling to breathe, sitting hunched forward, retractions, pulse > 120      Mild 5. RECURRENT SYMPTOM: "Have you had difficulty breathing before?" If Yes, ask: "When was the last time?" and "What happened that time?"      Yes 6. CARDIAC HISTORY: "Do you have any history of heart disease?" (e.g., heart attack, angina, bypass surgery, angioplasty)      No 7. LUNG HISTORY: "Do you have any history of lung disease?"  (e.g., pulmonary embolus, asthma, emphysema)     No 8. CAUSE: "What do you think  is causing the breathing problem?"      URI 9. OTHER SYMPTOMS: "Do you have any other symptoms? (e.g., dizziness, runny nose, cough, chest pain, fever)     Dizzy 10. O2 SATURATION MONITOR:  "Do you use an oxygen saturation monitor (pulse oximeter) at home?" If Yes, ask: "What is your reading (oxygen level) today?" "What is your usual oxygen saturation reading?" (e.g., 95%)       No 11. PREGNANCY: "Is there any chance you are pregnant?" "When was your last menstrual period?"       No 12. TRAVEL: "Have you traveled out of the country in the last month?" (e.g., travel history, exposures)       No  Protocols used: Breathing Difficulty-A-AH

## 2023-01-29 NOTE — Progress Notes (Signed)
Patient ID: Jade Mullins, female    DOB: [birth year]  MRN: [id number]  CC: Follow-up (ER f/u. /Patient does not feel better & still experiencing cough, fatigue SOB X1 week/No to flu vax.)   Subjective: Jade Mullins is a 27 y.o. female who presents for UC visit.  PCP was Dr. Delford Field who has retired. Her concerns today include:  Patient with history of HTN, diabetes type 2, obesity, tobacco dependence, HL  Discussed the use of AI scribe software for clinical note transcription with the patient, who gave verbal consent to proceed.  History of Present Illness   The patient, with a history of diabetes, hypertension, and hyperlipidemia, presents for follow-up after an ER visit on the 14th of this month for cough, congestion, dry throat, body aches, and decreased appetite.  She did not have elevated CBC chest x-ray tested negative for influenza and COVID.  She was diagnosed with a viral upper respiratory infection.  She was also seen for a laceration on the dorsal surface of the left hand that appeared infected. Prescribed doxycycline 100mg  twice daily, which she is still taking.  Since the ER visit, the patient reports persistent symptoms, including a feeling of weakness, shortness of breath with exertion, and headaches. She also reports new onset nausea, which she attributes to the doxycycline. The cough and congestion have improved but not completely resolved.  Denies any lower extremity edema recent long distance traveling and not on any hormonal control. Infection on the dorsal surface of the left is getting much better since being on antibiotics.  No drainage. Request letter for work verifying she was seen today.  DM: She had not been seen for primary care/chronic disease management in over 1 year. Checks BS once a day in the evenings. Reports poor control of her diabetes, with blood sugars ranging from 183 to 220.  -Supposed to be on Trulicity 0.75 mg once a week, Basaglar insulin 20  units daily and metformin twice a day.   -Out of Trulicity for the past 3 months.  Needs refill on metformin.  Reports she has been taking the Basaglar 20 units at bedtime.    HL: Admits that she has not been taking atorvastatin consistently.  Supposed to be on 20 mg daily. She was also supposed to be on lisinopril 2.5 mg daily for blood pressure but has not been taking consistently.   Patient Active Problem List   Diagnosis Date Noted   HTN (hypertension) 04/27/2020   Tobacco abuse 04/27/2020   Morbid obesity with BMI of 40.0-44.9, adult (HCC)    Type 2 diabetes mellitus with obesity (HCC)      Current Outpatient Medications on File Prior to Visit  Medication Sig Dispense Refill   Blood Glucose Monitoring Suppl (TRUE METRIX METER) w/Device KIT Use to measure blood sugar twice a day 1 kit 0   doxycycline (VIBRAMYCIN) 100 MG capsule Take 1 capsule (100 mg total) by mouth 2 (two) times daily. 20 capsule 0   Insulin Pen Needle (PEN NEEDLES 3/16") 31G X 5 MM MISC use as directed 4 (four) times daily. 100 each 3   Insulin Glargine (BASAGLAR KWIKPEN) 100 UNIT/ML Inject 20 Units into the skin daily. 15 mL 2   [DISCONTINUED] insulin lispro (HUMALOG KWIKPEN) 100 UNIT/ML KwikPen Inject 5 Units into the skin 3 (three) times daily. 15 mL 2   No current facility-administered medications on file prior to visit.    Allergies  Allergen Reactions   Naproxen     Lips  swelling    Social History   Socioeconomic History   Marital status: Single    Spouse name: Not on file   Number of children: Not on file   Years of education: Not on file   Highest education level: Not on file  Occupational History   Occupation: Production designer, theatre/television/film at [employer]  Tobacco Use   Smoking status: Some Days    Types: Cigars, Cigarettes   Smokeless tobacco: Never   Tobacco comments:    "like a month"  Vaping Use   Vaping status: Never Used  Substance and Sexual Activity   Alcohol use: Yes    Alcohol/week: 0.0 standard  drinks of alcohol    Comment: none since 11/20   Drug use: Not Currently    Types: Marijuana    Comment: last use over 6 months ago   Sexual activity: Not Currently  Other Topics Concern   Not on file  Social History Narrative   Not on file   Social Drivers of Health   Financial Resource Strain: Low Risk  (01/29/2023)   Overall Financial Resource Strain (CARDIA)    Difficulty of Paying Living Expenses: Not hard at all  Food Insecurity: No Food Insecurity (01/29/2023)   Hunger Vital Sign    Worried About Running Out of Food in the Last Year: Never true    Ran Out of Food in the Last Year: Never true  Transportation Needs: No Transportation Needs (01/29/2023)   PRAPARE - Administrator, Civil Service (Medical): No    Lack of Transportation (Non-Medical): No  Physical Activity: Inactive (01/29/2023)   Exercise Vital Sign    Days of Exercise per Week: 0 days    Minutes of Exercise per Session: 0 min  Stress: No Stress Concern Present (01/29/2023)   [employer] of Occupational Health - Occupational Stress Questionnaire    Feeling of Stress : Not at all  Social Connections: Socially Isolated (01/29/2023)   Social Connection and Isolation Panel [NHANES]    Frequency of Communication with Friends and Family: More than three times a week    Frequency of Social Gatherings with Friends and Family: Once a week    Attends Religious Services: Never    Database administrator or Organizations: No    Attends Banker Meetings: Never    Marital Status: Never married  Intimate Partner Violence: Not At Risk (01/29/2023)   Humiliation, Afraid, Rape, and Kick questionnaire    Fear of Current or Ex-Partner: No    Emotionally Abused: No    Physically Abused: No    Sexually Abused: No    Family History  Problem Relation Age of Onset   Arthritis Mother    Diabetes Mother    Diabetes Brother    Hypertension Maternal Aunt    Diabetes Maternal Aunt    Cancer  Maternal Aunt        prostate   Hypertension Maternal Uncle    Diabetes Maternal Uncle    Arthritis Maternal Grandmother    Heart disease Maternal Grandmother    Hypertension Maternal Grandmother    Diabetes Maternal Grandmother     Past Surgical History:  Procedure Laterality Date   NO PAST SURGERIES      ROS: Review of Systems Negative except as stated above  PHYSICAL EXAM: BP 114/82 (BP Location: Left Arm, Patient Position: Sitting, Cuff Size: Normal)   Pulse 90   Temp 98.4 F (36.9 C) (Oral)   Ht 5\' 5"  (1.651 m)  Wt 211 lb (95.7 kg)   SpO2 99%   BMI 35.11 kg/m   Physical Exam  General appearance - alert, well appearing, young obese AAF and in no distress Mental status - normal mood, behavior, speech, dress, motor activity, and thought processes Eyes - pupils equal and reactive, extraocular eye movements intact Nose - normal and patent, no erythema, discharge or polyps Mouth - mucous membranes moist, pharynx normal without lesions Chest - clear to auscultation, no wheezes, rales or rhonchi, symmetric air entry Heart - normal rate, regular rhythm, normal S1, S2, no murmurs, rubs, clicks or gallops Extremities - peripheral pulses normal, no pedal edema, no clubbing or cyanosis Skin: Left hand -2 to 3 cm lacerated area on the dorsal surface it is scabbed over.  Mild surrounding edema but no fluctuance or erythema.       Latest Ref Rng & Units 01/27/2023    6:24 PM 10/26/2021   10:02 AM 02/26/2020   10:13 AM  CMP  Glucose 70 - 99 mg/dL 604  540  981   BUN 6 - 20 mg/dL 9  7  7    Creatinine 0.44 - 1.00 mg/dL 1.91  4.78  2.95   Sodium 135 - 145 mmol/L 136  139  142   Potassium 3.5 - 5.1 mmol/L 4.0  4.6  4.5   Chloride 98 - 111 mmol/L 102  101  105   CO2 22 - 32 mmol/L 23  22  23    Calcium 8.9 - 10.3 mg/dL 9.3  9.4  9.2   Total Protein 6.0 - 8.5 g/dL  7.1  6.5   Total Bilirubin 0.0 - 1.2 mg/dL  0.3  0.2   Alkaline Phos 44 - 121 IU/L  93  70   AST 0 - 40 IU/L  16   11   ALT 0 - 32 IU/L  11  7    Lipid Panel     Component Value Date/Time   CHOL 191 10/26/2021 1002   TRIG 57 10/26/2021 1002   HDL 65 10/26/2021 1002   CHOLHDL 2.9 10/26/2021 1002   CHOLHDL 3 04/03/2016 0832   VLDL 9.0 04/03/2016 0832   LDLCALC 115 (H) 10/26/2021 1002    CBC    Component Value Date/Time   WBC 5.4 01/27/2023 1824   RBC 4.94 01/27/2023 1824   HGB 12.8 01/27/2023 1824   HGB 11.2 10/26/2021 1002   HCT 39.5 01/27/2023 1824   HCT 35.5 10/26/2021 1002   PLT 216 01/27/2023 1824   PLT 289 10/26/2021 1002   MCV 80.0 01/27/2023 1824   MCV 82 10/26/2021 1002   MCH 25.9 (L) 01/27/2023 1824   MCHC 32.4 01/27/2023 1824   RDW 16.9 (H) 01/27/2023 1824   RDW 14.9 10/26/2021 1002   LYMPHSABS 1.6 01/27/2023 1824   LYMPHSABS 2.4 10/26/2021 1002   MONOABS 0.7 01/27/2023 1824   EOSABS 0.2 01/27/2023 1824   EOSABS 0.2 10/26/2021 1002   BASOSABS 0.0 01/27/2023 1824   BASOSABS 0.0 10/26/2021 1002      01/29/2023    3:51 PM 10/26/2021    9:39 AM 01/14/2020   11:33 AM  Depression screen PHQ 2/9  Decreased Interest 2 1 1   Down, Depressed, Hopeless 0 2 0  PHQ - 2 Score 2 3 1   Altered sleeping 2 3 3   Tired, decreased energy 3 2 3   Change in appetite 3 2 3   Feeling bad or failure about yourself  0  1  Trouble concentrating 0 0  3  Moving slowly or fidgety/restless 0 0 1  Suicidal thoughts 0 0 0  PHQ-9 Score 10 10 15   Difficult doing work/chores Not difficult at all       ASSESSMENT AND PLAN: 1. Upper respiratory tract infection, unspecified type (Primary) Patient reports cough and congestion is better but she feels short of breath on exertion.  Not hear any wheezing on exam but we will try her with albuterol inhaler.  I went over with her how to use it.  We will also screen for PE and CHF with D-dimer and BNP - albuterol (VENTOLIN HFA) 108 (90 Base) MCG/ACT inhaler; Inhale 2 puffs into the lungs every 6 (six) hours as needed for wheezing or shortness of breath.   Dispense: 6.7 g; Refill: 2  2. Shortness of breath See #1 above - D-dimer, quantitative - Brain natriuretic peptide - albuterol (VENTOLIN HFA) 108 (90 Base) MCG/ACT inhaler; Inhale 2 puffs into the lungs every 6 (six) hours as needed for wheezing or shortness of breath.  Dispense: 6.7 g; Refill: 2  3. Uncontrolled type 2 diabetes mellitus with hyperglycemia (HCC) We will check A1c. Restart metformin, Trulicity and continue glargine insulin 20 units daily.  Advised compliance with medications and routine follow-up visit.  Encouraged her to check blood sugars at least twice a day before breakfast and before dinner.  Goal for blood sugars before meals is 90-130.  Bring readings with her on next visit. - metFORMIN (GLUCOPHAGE) 500 MG tablet; Take 2 tablets (1,000 mg total) by mouth 2 (two) times daily with a meal.  Dispense: 120 tablet; Refill: 2 - lisinopril (ZESTRIL) 5 MG tablet; Take 0.5 tablets (2.5 mg total) by mouth daily.  Dispense: 45 tablet; Refill: 3 - Comprehensive metabolic panel - Microalbumin / creatinine urine ratio - Ambulatory referral to Ophthalmology - Hemoglobin A1c  4. Insulin long-term use (HCC) 5. Diabetes mellitus treated with oral medication (HCC) 6. Long-term (current) use of injectable non-insulin antidiabetic drugs See #3 above  7. Hypertension associated with type 2 diabetes mellitus (HCC) - lisinopril (ZESTRIL) 5 MG tablet; Take 0.5 tablets (2.5 mg total) by mouth daily.  Dispense: 45 tablet; Refill: 3  8. Hyperlipidemia associated with type 2 diabetes mellitus (HCC) Restart atorvastatin.  Prescription sent to the pharmacy - Lipid panel - atorvastatin (LIPITOR) 20 MG tablet; Take 1 tablet (20 mg total) by mouth daily.  Dispense: 90 tablet; Refill: 3  9. Laceration of deep palmar arch of left hand, subsequent encounter I was able to see the pictures that they took of this from the emergency room.  Compared to what I am seeing today, it is clinically improved.   Advised patient to continue the doxycycline until completed.  10.  Pos dep screen We did not get to address this today.  Will address on subsequent visit Patient was given the opportunity to ask questions.  Patient verbalized understanding of the plan and was able to repeat key elements of the plan.   This documentation was completed using Paediatric nurse.  Any transcriptional errors are unintentional.  Orders Placed This Encounter  Procedures   Lipid panel   Comprehensive metabolic panel   Microalbumin / creatinine urine ratio   D-dimer, quantitative   Brain natriuretic peptide   Hemoglobin A1c   Ambulatory referral to Ophthalmology     Requested Prescriptions   Signed Prescriptions Disp Refills   metFORMIN (GLUCOPHAGE) 500 MG tablet 120 tablet 2    Sig: Take 2 tablets (1,000 mg total) by  mouth 2 (two) times daily with a meal.   Dulaglutide (TRULICITY) 0.75 MG/0.5ML SOAJ 2 mL 2    Sig: Inject 0.75 mg into the skin once a week.   lisinopril (ZESTRIL) 5 MG tablet 45 tablet 3    Sig: Take 0.5 tablets (2.5 mg total) by mouth daily.   albuterol (VENTOLIN HFA) 108 (90 Base) MCG/ACT inhaler 6.7 g 2    Sig: Inhale 2 puffs into the lungs every 6 (six) hours as needed for wheezing or shortness of breath.   atorvastatin (LIPITOR) 20 MG tablet 90 tablet 3    Sig: Take 1 tablet (20 mg total) by mouth daily.    Return in about 6 weeks (around 03/12/2023) for f/u on Diabetes.  Jonah Blue, MD, FACP

## 2023-01-30 ENCOUNTER — Other Ambulatory Visit: Payer: Self-pay

## 2023-01-30 LAB — MICROALBUMIN / CREATININE URINE RATIO

## 2023-02-01 ENCOUNTER — Telehealth: Payer: Self-pay

## 2023-02-01 LAB — COMPREHENSIVE METABOLIC PANEL
ALT: 13 [IU]/L (ref 0–32)
AST: 13 [IU]/L (ref 0–40)
Albumin: 4.3 g/dL (ref 4.0–5.0)
Alkaline Phosphatase: 101 [IU]/L (ref 44–121)
BUN/Creatinine Ratio: 15 (ref 9–23)
BUN: 9 mg/dL (ref 6–20)
Bilirubin Total: 0.3 mg/dL (ref 0.0–1.2)
CO2: 21 mmol/L (ref 20–29)
Calcium: 9.6 mg/dL (ref 8.7–10.2)
Chloride: 100 mmol/L (ref 96–106)
Creatinine, Ser: 0.6 mg/dL (ref 0.57–1.00)
Globulin, Total: 3.4 g/dL (ref 1.5–4.5)
Glucose: 314 mg/dL — ABNORMAL HIGH (ref 70–99)
Potassium: 4.7 mmol/L (ref 3.5–5.2)
Sodium: 137 mmol/L (ref 134–144)
Total Protein: 7.7 g/dL (ref 6.0–8.5)
eGFR: 125 mL/min/{1.73_m2} (ref >59)

## 2023-02-01 LAB — LIPID PANEL
Chol/HDL Ratio: 3.3 {ratio} (ref 0.0–4.4)
Cholesterol, Total: 224 mg/dL — ABNORMAL HIGH (ref 100–199)
HDL: 67 mg/dL (ref >39)
LDL Chol Calc (NIH): 145 mg/dL — ABNORMAL HIGH (ref 0–99)
Triglycerides: 71 mg/dL (ref 0–149)
VLDL Cholesterol Cal: 12 mg/dL (ref 5–40)

## 2023-02-01 LAB — MICROALBUMIN / CREATININE URINE RATIO

## 2023-02-01 LAB — D-DIMER, QUANTITATIVE: D-DIMER: 0.39 mg{FEU}/L (ref 0.00–0.49)

## 2023-02-01 LAB — HEMOGLOBIN A1C
Est. average glucose Bld gHb Est-mCnc: 303 mg/dL
Hgb A1c MFr Bld: 12.2 % — ABNORMAL HIGH (ref 4.8–5.6)

## 2023-02-01 LAB — BRAIN NATRIURETIC PEPTIDE: BNP: 6.4 pg/mL (ref 0.0–100.0)

## 2023-02-01 NOTE — Telephone Encounter (Signed)
Pt was called and is aware of results, DOB was confirmed.  ?

## 2023-02-01 NOTE — Telephone Encounter (Signed)
-----   Message from [facility] Anish Vana M sent at 02/01/2023 10:38 AM EST -----  ----- Message ----- From: Marcine Matar, MD Sent: 01/30/2023   9:39 PM EST To: Johna Roles, CMA  Cholesterol level is elevated at 145 with goal being less than 70.  This increases risk for heart attack and strokes.  I refilled the cholesterol-lowering medication atorvastatin on recent visit. Kidney and liver function tests are good. A1c is 12.2 meaning that your diabetes is not well-controlled at all.  Please take your insulin and other diabetic medications as prescribed on recent visit. Blood screening test for congestive heart failure was normal.

## 2023-02-02 ENCOUNTER — Other Ambulatory Visit: Payer: Self-pay

## 2023-03-13 ENCOUNTER — Ambulatory Visit: Payer: BC Managed Care – PPO | Admitting: Internal Medicine

## 2023-04-13 ENCOUNTER — Other Ambulatory Visit: Payer: Self-pay

## 2023-04-13 ENCOUNTER — Encounter: Payer: Self-pay | Admitting: Internal Medicine

## 2023-04-13 ENCOUNTER — Ambulatory Visit: Payer: BC Managed Care – PPO | Attending: Internal Medicine | Admitting: Internal Medicine

## 2023-04-13 VITALS — BP 130/91 | HR 97 | Temp 98.2°F | Ht 65.0 in | Wt 204.0 lb

## 2023-04-13 DIAGNOSIS — Z6833 Body mass index (BMI) 33.0-33.9, adult: Secondary | ICD-10-CM

## 2023-04-13 DIAGNOSIS — I152 Hypertension secondary to endocrine disorders: Secondary | ICD-10-CM

## 2023-04-13 DIAGNOSIS — Z7985 Long-term (current) use of injectable non-insulin antidiabetic drugs: Secondary | ICD-10-CM | POA: Diagnosis not present

## 2023-04-13 DIAGNOSIS — Z7984 Long term (current) use of oral hypoglycemic drugs: Secondary | ICD-10-CM | POA: Diagnosis not present

## 2023-04-13 DIAGNOSIS — E119 Type 2 diabetes mellitus without complications: Secondary | ICD-10-CM

## 2023-04-13 DIAGNOSIS — E1169 Type 2 diabetes mellitus with other specified complication: Secondary | ICD-10-CM | POA: Diagnosis not present

## 2023-04-13 DIAGNOSIS — E669 Obesity, unspecified: Secondary | ICD-10-CM

## 2023-04-13 DIAGNOSIS — Z794 Long term (current) use of insulin: Secondary | ICD-10-CM | POA: Diagnosis not present

## 2023-04-13 DIAGNOSIS — E785 Hyperlipidemia, unspecified: Secondary | ICD-10-CM

## 2023-04-13 DIAGNOSIS — I10 Essential (primary) hypertension: Secondary | ICD-10-CM

## 2023-04-13 DIAGNOSIS — Z2821 Immunization not carried out because of patient refusal: Secondary | ICD-10-CM

## 2023-04-13 LAB — GLUCOSE, POCT (MANUAL RESULT ENTRY): POC Glucose: 240 mg/dL — AB (ref 70–99)

## 2023-04-13 MED ORDER — BASAGLAR KWIKPEN 100 UNIT/ML ~~LOC~~ SOPN
25.0000 [IU] | PEN_INJECTOR | Freq: Every day | SUBCUTANEOUS | 6 refills | Status: DC
  Filled 2023-04-13: qty 6, 24d supply, fill #0

## 2023-04-13 MED ORDER — VALSARTAN 40 MG PO TABS
40.0000 mg | ORAL_TABLET | Freq: Every day | ORAL | 3 refills | Status: AC
  Filled 2023-04-13: qty 30, 30d supply, fill #0

## 2023-04-13 MED ORDER — TRULICITY 1.5 MG/0.5ML ~~LOC~~ SOAJ
1.5000 mg | SUBCUTANEOUS | 1 refills | Status: AC
Start: 2023-04-13 — End: ?
  Filled 2023-04-13 – 2023-06-15 (×3): qty 2, 28d supply, fill #0

## 2023-04-13 NOTE — Patient Instructions (Signed)
 Stop lisinopril. Start valsartan 40 mg daily instead for blood pressure. Increase Trulicity to 1.5 mg once a week.  Stop the medication and call me if you develop any abdominal pain, vomiting, severe diarrhea/constipation.

## 2023-04-13 NOTE — Progress Notes (Signed)
 Patient ID: Jade Mullins, female    DOB: [birth year]  MRN: [id number]  CC: Diabetes (DM & HTN f/u. Med refill. Franchot Erichsen BS readings and possible additional meds /Pt received eye exam at [facility] /No to pneumonia vax. Yes to pap for another appt.)   Subjective: Jade Mullins is a 28 y.o. female who presents for chronic ds management.   Her concerns today include:  Patient with history of DM type II, HTN, HL,  Discussed the use of AI scribe software for clinical note transcription with the patient, who gave verbal consent to proceed.  History of Present Illness   The patient, with a history of diabetes, HTN and high cholesterol, presents for a follow-up visit.   DM: Lab Results  Component Value Date   HGBA1C 12.2 (H) 01/29/2023  She reports high blood sugar levels despite being on Basaglar insulin 25 units daily, Trulicity 0.75 mg weekly, and metformin 1 gram BID. The patient checks her blood sugar twice a day, with readings ranging from 150-180 in the morning before BF with highest of 280 3 wks ago and consistently in the 200s in the evening. She has made changes to her diet, including cutting out fried foods and sugary drinks. She also reports walking for about 20 minutes before work.  She is down 7 pounds since last visit with me 2 months ago -The patient has been experiencing soreness at the site of Trulicity injections, which lasts for several days. She denies any redness or drainage at the injection site.  -She also reports a recent eye exam done 03/06/23 at [facility]; told she has a small bleed noted in the left eye.   HTN: has not been taking her lisinopril due to feelings of nausea and lightheadedness.  No device to check blood pressure.  She tries to limit salt in the foods.  HL taking and tolerating the atorvastatin 20 mg that was prescribed on last visit.  LDL at that visit was 145, not at goal.     Patient Active Problem List   Diagnosis Date Noted   HTN  (hypertension) 04/27/2020   Tobacco abuse 04/27/2020   Type 2 diabetes mellitus with obesity (HCC)      Current Outpatient Medications on File Prior to Visit  Medication Sig Dispense Refill   albuterol (VENTOLIN HFA) 108 (90 Base) MCG/ACT inhaler Inhale 2 puffs into the lungs every 6 (six) hours as needed for wheezing or shortness of breath. 6.7 g 2   atorvastatin (LIPITOR) 20 MG tablet Take 1 tablet (20 mg total) by mouth daily. 90 tablet 3   Blood Glucose Monitoring Suppl (TRUE METRIX METER) w/Device KIT Use to measure blood sugar twice a day 1 kit 0   Insulin Pen Needle (PEN NEEDLES 3/16") 31G X 5 MM MISC use as directed 4 (four) times daily. 100 each 3   metFORMIN (GLUCOPHAGE) 500 MG tablet Take 2 tablets (1,000 mg total) by mouth 2 (two) times daily with a meal. 120 tablet 2   [DISCONTINUED] insulin lispro (HUMALOG KWIKPEN) 100 UNIT/ML KwikPen Inject 5 Units into the skin 3 (three) times daily. 15 mL 2   No current facility-administered medications on file prior to visit.    Allergies  Allergen Reactions   Lisinopril Other (See Comments)    Nausea and lightheaded   Naproxen     Lips swelling    Social History   Socioeconomic History   Marital status: Single    Spouse name: Not on file  Number of children: Not on file   Years of education: Not on file   Highest education level: 12th grade  Occupational History   Occupation: Production designer, theatre/television/film at [employer]  Tobacco Use   Smoking status: Some Days    Types: Cigars, Cigarettes   Smokeless tobacco: Never   Tobacco comments:    "like a month"  Vaping Use   Vaping status: Never Used  Substance and Sexual Activity   Alcohol use: Yes    Alcohol/week: 0.0 standard drinks of alcohol    Comment: none since 11/20   Drug use: Not Currently    Types: Marijuana    Comment: last use over 6 months ago   Sexual activity: Not Currently  Other Topics Concern   Not on file  Social History Narrative   Not on file   Social Drivers of  Health   Financial Resource Strain: Low Risk  (04/12/2023)   Overall Financial Resource Strain (CARDIA)    Difficulty of Paying Living Expenses: Not very hard  Food Insecurity: Food Insecurity Present (04/12/2023)   Hunger Vital Sign    Worried About Running Out of Food in the Last Year: Sometimes true    Ran Out of Food in the Last Year: Sometimes true  Transportation Needs: No Transportation Needs (04/12/2023)   PRAPARE - Administrator, Civil Service (Medical): No    Lack of Transportation (Non-Medical): No  Physical Activity: Sufficiently Active (04/12/2023)   Exercise Vital Sign    Days of Exercise per Week: 5 days    Minutes of Exercise per Session: 30 min  Recent Concern: Physical Activity - Inactive (01/29/2023)   Exercise Vital Sign    Days of Exercise per Week: 0 days    Minutes of Exercise per Session: 0 min  Stress: Stress Concern Present (04/12/2023)   [employer] of Occupational Health - Occupational Stress Questionnaire    Feeling of Stress : Rather much  Social Connections: Unknown (04/12/2023)   Social Connection and Isolation Panel [NHANES]    Frequency of Communication with Friends and Family: More than three times a week    Frequency of Social Gatherings with Friends and Family: Never    Attends Religious Services: Patient declined    Database administrator or Organizations: No    Attends Banker Meetings: Never    Marital Status: Living with partner  Recent Concern: Social Connections - Socially Isolated (01/29/2023)   Social Connection and Isolation Panel [NHANES]    Frequency of Communication with Friends and Family: More than three times a week    Frequency of Social Gatherings with Friends and Family: Once a week    Attends Religious Services: Never    Database administrator or Organizations: No    Attends Banker Meetings: Never    Marital Status: Never married  Intimate Partner Violence: Not At Risk (01/29/2023)    Humiliation, Afraid, Rape, and Kick questionnaire    Fear of Current or Ex-Partner: No    Emotionally Abused: No    Physically Abused: No    Sexually Abused: No    Family History  Problem Relation Age of Onset   Arthritis Mother    Diabetes Mother    Diabetes Brother    Hypertension Maternal Aunt    Diabetes Maternal Aunt    Cancer Maternal Aunt        prostate   Hypertension Maternal Uncle    Diabetes Maternal Uncle    Arthritis Maternal  Grandmother    Heart disease Maternal Grandmother    Hypertension Maternal Grandmother    Diabetes Maternal Grandmother     Past Surgical History:  Procedure Laterality Date   NO PAST SURGERIES      ROS: Review of Systems Negative except as stated above  PHYSICAL EXAM: BP (!) 130/91 (BP Location: Left Arm, Patient Position: Sitting, Cuff Size: Large)   Pulse 97   Temp 98.2 F (36.8 C) (Oral)   Ht 5\' 5"  (1.651 m)   Wt 204 lb (92.5 kg)   SpO2 99%   BMI 33.95 kg/m   Wt Readings from Last 3 Encounters:  04/13/23 204 lb (92.5 kg)  01/29/23 211 lb (95.7 kg)  01/27/23 216 lb (98 kg)    Physical Exam  General appearance - alert, well appearing, young obese African-American female and in no distress Mental status - normal mood, behavior, speech, dress, motor activity, and thought processes Chest - clear to auscultation, no wheezes, rales or rhonchi, symmetric air entry Heart - normal rate, regular rhythm, normal S1, S2, no murmurs, rubs, clicks or gallops Extremities - peripheral pulses normal, no pedal edema, no clubbing or cyanosis Diabetic Foot Exam - Simple   Simple Foot Form Diabetic Foot exam was performed with the following findings: Yes 04/13/2023  9:31 AM  Visual Inspection See comments: Yes Sensation Testing Intact to touch and monofilament testing bilaterally: Yes Pulse Check Posterior Tibialis and Dorsalis pulse intact bilaterally: Yes Comments Plantar surface of both feet are dried and cracked.  Few of the  toenails are overgrown.         Latest Ref Rng & Units 01/29/2023    4:47 PM 01/27/2023    6:24 PM 10/26/2021   10:02 AM  CMP  Glucose 70 - 99 mg/dL 161  096  045   BUN 6 - 20 mg/dL 9  9  7    Creatinine 0.57 - 1.00 mg/dL 4.09  8.11  9.14   Sodium 134 - 144 mmol/L 137  136  139   Potassium 3.5 - 5.2 mmol/L 4.7  4.0  4.6   Chloride 96 - 106 mmol/L 100  102  101   CO2 20 - 29 mmol/L 21  23  22    Calcium 8.7 - 10.2 mg/dL 9.6  9.3  9.4   Total Protein 6.0 - 8.5 g/dL 7.7   7.1   Total Bilirubin 0.0 - 1.2 mg/dL 0.3   0.3   Alkaline Phos 44 - 121 IU/L 101   93   AST 0 - 40 IU/L 13   16   ALT 0 - 32 IU/L 13   11    Lipid Panel     Component Value Date/Time   CHOL 224 (H) 01/29/2023 1647   TRIG 71 01/29/2023 1647   HDL 67 01/29/2023 1647   CHOLHDL 3.3 01/29/2023 1647   CHOLHDL 3 04/03/2016 0832   VLDL 9.0 04/03/2016 0832   LDLCALC 145 (H) 01/29/2023 1647    CBC    Component Value Date/Time   WBC 5.4 01/27/2023 1824   RBC 4.94 01/27/2023 1824   HGB 12.8 01/27/2023 1824   HGB 11.2 10/26/2021 1002   HCT 39.5 01/27/2023 1824   HCT 35.5 10/26/2021 1002   PLT 216 01/27/2023 1824   PLT 289 10/26/2021 1002   MCV 80.0 01/27/2023 1824   MCV 82 10/26/2021 1002   MCH 25.9 (L) 01/27/2023 1824   MCHC 32.4 01/27/2023 1824   RDW 16.9 (H) 01/27/2023 1824  RDW 14.9 10/26/2021 1002   LYMPHSABS 1.6 01/27/2023 1824   LYMPHSABS 2.4 10/26/2021 1002   MONOABS 0.7 01/27/2023 1824   EOSABS 0.2 01/27/2023 1824   EOSABS 0.2 10/26/2021 1002   BASOSABS 0.0 01/27/2023 1824   BASOSABS 0.0 10/26/2021 1002    ASSESSMENT AND PLAN: 1. Type 2 diabetes mellitus with obesity (HCC) (Primary) I am sure blood sugars have improved since last visit now that she is taking her medications consistently.  However they are not at goal.  We discussed increasing Trulicity to 1.5 mg once a week.  Went over side effects again to watch for with dose increase. Continue glargine insulin 25 units at bedtime and  metformin 1 g twice a day. Encouraged her to continue trying to eat healthy and to move as much as she can. - POCT glucose (manual entry) - Insulin Glargine (BASAGLAR KWIKPEN) 100 UNIT/ML; Inject 25 Units into the skin at bedtime.  Dispense: 15 mL; Refill: 6 - Dulaglutide (TRULICITY) 1.5 MG/0.5ML SOAJ; Inject 1.5 mg into the skin once a week.  Dispense: 6 mL; Refill: 1  2. Diabetes mellitus treated with oral medication (HCC) 3. Long-term (current) use of injectable non-insulin antidiabetic drugs See #1 above  4. Insulin long-term use (HCC) See #1 above  5. Hypertension associated with type 2 diabetes mellitus (HCC) Not at goal.  She did not tolerate lisinopril.  Changed to Diovan 40 mg daily instead. - valsartan (DIOVAN) 40 MG tablet; Take 1 tablet (40 mg total) by mouth daily.  Dispense: 90 tablet; Refill: 3  6. Hyperlipidemia associated with type 2 diabetes mellitus (HCC) Continue atorvastatin.  Will plan to recheck lipid profile on a future visit  7. Pneumococcal vaccination declined  Patient was given the opportunity to ask questions.  Patient verbalized understanding of the plan and was able to repeat key elements of the plan.   This documentation was completed using Paediatric nurse.  Any transcriptional errors are unintentional.  Orders Placed This Encounter  Procedures   POCT glucose (manual entry)     Requested Prescriptions   Signed Prescriptions Disp Refills   Insulin Glargine (BASAGLAR KWIKPEN) 100 UNIT/ML 15 mL 6    Sig: Inject 25 Units into the skin at bedtime.   Dulaglutide (TRULICITY) 1.5 MG/0.5ML SOAJ 6 mL 1    Sig: Inject 1.5 mg into the skin once a week.   valsartan (DIOVAN) 40 MG tablet 90 tablet 3    Sig: Take 1 tablet (40 mg total) by mouth daily.    Return in about 6 weeks (around 05/25/2023) for PAP and recheck A1C.  Jonah Blue, MD, FACP

## 2023-04-24 ENCOUNTER — Other Ambulatory Visit: Payer: Self-pay

## 2023-04-25 ENCOUNTER — Other Ambulatory Visit: Payer: Self-pay

## 2023-06-14 ENCOUNTER — Other Ambulatory Visit (HOSPITAL_COMMUNITY)
Admission: RE | Admit: 2023-06-14 | Discharge: 2023-06-14 | Disposition: A | Discharge status: Home / Self Care | Source: Ambulatory Visit | Attending: Internal Medicine | Admitting: Internal Medicine

## 2023-06-14 ENCOUNTER — Encounter: Payer: Self-pay | Admitting: Internal Medicine

## 2023-06-14 ENCOUNTER — Other Ambulatory Visit: Payer: Self-pay

## 2023-06-14 ENCOUNTER — Ambulatory Visit: Payer: BC Managed Care – PPO | Attending: Internal Medicine | Admitting: Internal Medicine

## 2023-06-14 VITALS — BP 107/74 | HR 89 | Temp 98.3°F | Ht 65.0 in | Wt 194.0 lb

## 2023-06-14 DIAGNOSIS — E119 Type 2 diabetes mellitus without complications: Secondary | ICD-10-CM | POA: Diagnosis not present

## 2023-06-14 DIAGNOSIS — Z124 Encounter for screening for malignant neoplasm of cervix: Secondary | ICD-10-CM | POA: Insufficient documentation

## 2023-06-14 DIAGNOSIS — Z6832 Body mass index (BMI) 32.0-32.9, adult: Secondary | ICD-10-CM

## 2023-06-14 DIAGNOSIS — Z113 Encounter for screening for infections with a predominantly sexual mode of transmission: Secondary | ICD-10-CM | POA: Insufficient documentation

## 2023-06-14 DIAGNOSIS — E669 Obesity, unspecified: Secondary | ICD-10-CM

## 2023-06-14 DIAGNOSIS — Z794 Long term (current) use of insulin: Secondary | ICD-10-CM

## 2023-06-14 LAB — POCT GLYCOSYLATED HEMOGLOBIN (HGB A1C): HbA1c, POC (controlled diabetic range): 13.9 % — AB (ref 0.0–7.0)

## 2023-06-14 LAB — GLUCOSE, POCT (MANUAL RESULT ENTRY): POC Glucose: 311 mg/dL — AB (ref 70–99)

## 2023-06-14 MED ORDER — DEXCOM G7 SENSOR MISC
12 refills | Status: AC
  Filled 2023-06-14 – 2023-06-18 (×2): qty 3, 30d supply, fill #0

## 2023-06-14 MED ORDER — BASAGLAR KWIKPEN 100 UNIT/ML ~~LOC~~ SOPN
34.0000 [IU] | PEN_INJECTOR | Freq: Every day | SUBCUTANEOUS | 6 refills | Status: AC
  Filled 2023-06-14: qty 12, 34d supply, fill #0

## 2023-06-14 NOTE — Progress Notes (Signed)
 Patient ID: Jade Mullins, female    DOB: 1995/06/05  MRN: [id number]  CC: Gynecologic Exam (Pap. /No questions / concerns/)   Subjective: Jade Mullins is a 28 y.o. female who presents for PAP and recheck DM Her concerns today include:  Patient with history of DM type II, HTN, HL,   Discussed the use of AI scribe software for clinical note transcription with the patient, who gave verbal consent to proceed.  History of Present Illness Jade Mullins is a 28 year old female with diabetes who presents for a Pap smear and follow-up on her diabetes management.  Her A1c has increased from 12% in December to 13.9% today, despite adherence to Trulicity  1.5 mg weekly, Glargine insulin  25 units daily, and metformin  1000 mg twice daily. She takes insulin  at 1 AM daily due to work schedule and metformin  BID. Blood sugar readings upon waking which is usually around 1 p.m range from 175 to 320 mg/dL. She attributes some blood sugar issues to her diet, which includes energy drinks and fruits like grapes and pineapples which she eats daily. She has lost 10 pounds since February after Trulicity  dose was increased, now weighing 194 pounds, She works as a Production designer, theatre/television/film at [employer], involving significant physical activity, and walks frequently but does not engage in structured exercise. She was diagnosed with diabetes in 2021. Her family history is significant for diabetes, affecting her mother, brother, aunts, uncles, and grandmother.   GYN History:  Pt is GP Any hx of abn paps?: no Menses regular or irregular?: regular How long does menses last? 5-7 days Menstrual flow light or heavy?: start heavy and taper Method of birth control?:  no Any vaginal dischg at this time?: no Dysuria?: no Any hx of STI?: no Sexually active with how many partners: not active Desires STI screen: yes Last MMG: No Family hx of uterine, cervical or breast cancer?:  no    Patient Active Problem List   Diagnosis Date  Noted   HTN (hypertension) 04/27/2020   Tobacco abuse 04/27/2020   Type 2 diabetes mellitus with obesity (HCC)      Current Outpatient Medications on File Prior to Visit  Medication Sig Dispense Refill   albuterol  (VENTOLIN  HFA) 108 (90 Base) MCG/ACT inhaler Inhale 2 puffs into the lungs every 6 (six) hours as needed for wheezing or shortness of breath. 6.7 g 2   atorvastatin  (LIPITOR) 20 MG tablet Take 1 tablet (20 mg total) by mouth daily. 90 tablet 3   Blood Glucose Monitoring Suppl (TRUE METRIX METER) w/Device KIT Use to measure blood sugar twice a day 1 kit 0   Dulaglutide  (TRULICITY ) 1.5 MG/0.5ML SOAJ Inject 1.5 mg into the skin once a week. 6 mL 1   Insulin  Pen Needle (PEN NEEDLES 3/16") 31G X 5 MM MISC use as directed 4 (four) times daily. 100 each 3   metFORMIN  (GLUCOPHAGE ) 500 MG tablet Take 2 tablets (1,000 mg total) by mouth 2 (two) times daily with a meal. 120 tablet 2   valsartan  (DIOVAN ) 40 MG tablet Take 1 tablet (40 mg total) by mouth daily. 90 tablet 3   [DISCONTINUED] insulin  lispro (HUMALOG  KWIKPEN) 100 UNIT/ML KwikPen Inject 5 Units into the skin 3 (three) times daily. 15 mL 2   No current facility-administered medications on file prior to visit.    Allergies  Allergen Reactions   Lisinopril  Other (See Comments)    Nausea and lightheaded   Naproxen      Lips swelling  Social History   Socioeconomic History   Marital status: Single    Spouse name: Not on file   Number of children: Not on file   Years of education: Not on file   Highest education level: 12th grade  Occupational History   Occupation: Production designer, theatre/television/film at [employer]  Tobacco Use   Smoking status: Some Days    Types: Cigars, Cigarettes   Smokeless tobacco: Never   Tobacco comments:    "like a month"  Vaping Use   Vaping status: Never Used  Substance and Sexual Activity   Alcohol use: Yes    Alcohol/week: 0.0 standard drinks of alcohol    Comment: none since 11/20   Drug use: Not Currently     Types: Marijuana    Comment: last use over 6 months ago   Sexual activity: Not Currently  Other Topics Concern   Not on file  Social History Narrative   Not on file   Social Drivers of Health   Financial Resource Strain: Low Risk  (04/12/2023)   Overall Financial Resource Strain (CARDIA)    Difficulty of Paying Living Expenses: Not very hard  Food Insecurity: Food Insecurity Present (04/12/2023)   Hunger Vital Sign    Worried About Running Out of Food in the Last Year: Sometimes true    Ran Out of Food in the Last Year: Sometimes true  Transportation Needs: No Transportation Needs (04/12/2023)   PRAPARE - Administrator, Civil Service (Medical): No    Lack of Transportation (Non-Medical): No  Physical Activity: Sufficiently Active (04/12/2023)   Exercise Vital Sign    Days of Exercise per Week: 5 days    Minutes of Exercise per Session: 30 min  Recent Concern: Physical Activity - Inactive (01/29/2023)   Exercise Vital Sign    Days of Exercise per Week: 0 days    Minutes of Exercise per Session: 0 min  Stress: Stress Concern Present (04/12/2023)   [employer] of Occupational Health - Occupational Stress Questionnaire    Feeling of Stress : Rather much  Social Connections: Unknown (04/12/2023)   Social Connection and Isolation Panel [NHANES]    Frequency of Communication with Friends and Family: More than three times a week    Frequency of Social Gatherings with Friends and Family: Never    Attends Religious Services: Patient declined    Database administrator or Organizations: No    Attends Banker Meetings: Never    Marital Status: Living with partner  Recent Concern: Social Connections - Socially Isolated (01/29/2023)   Social Connection and Isolation Panel [NHANES]    Frequency of Communication with Friends and Family: More than three times a week    Frequency of Social Gatherings with Friends and Family: Once a week    Attends Religious  Services: Never    Database administrator or Organizations: No    Attends Banker Meetings: Never    Marital Status: Never married  Intimate Partner Violence: Not At Risk (01/29/2023)   Humiliation, Afraid, Rape, and Kick questionnaire    Fear of Current or Ex-Partner: No    Emotionally Abused: No    Physically Abused: No    Sexually Abused: No    Family History  Problem Relation Age of Onset   Arthritis Mother    Diabetes Mother    Diabetes Brother    Hypertension Maternal Aunt    Diabetes Maternal Aunt    Cancer Maternal Aunt  prostate   Hypertension Maternal Uncle    Diabetes Maternal Uncle    Arthritis Maternal Grandmother    Heart disease Maternal Grandmother    Hypertension Maternal Grandmother    Diabetes Maternal Grandmother     Past Surgical History:  Procedure Laterality Date   NO PAST SURGERIES      ROS: Review of Systems Negative except as stated above  PHYSICAL EXAM: BP 107/74 (BP Location: Left Arm, Patient Position: Sitting, Cuff Size: Normal)   Pulse 89   Temp 98.3 F (36.8 C) (Oral)   Ht 5\' 5"  (1.651 m)   Wt 194 lb (88 kg)   SpO2 99%   BMI 32.28 kg/m   Wt Readings from Last 3 Encounters:  06/14/23 194 lb (88 kg)  04/13/23 204 lb (92.5 kg)  01/29/23 211 lb (95.7 kg)    Physical Exam  General appearance - alert, well appearing, young AAF and in no distress Mental status - normal mood, behavior, speech, dress, motor activity, and thought processes Pelvic - CMA Clarisa present: normal external genitalia, vulva, vagina, cervix, uterus and adnexa      Latest Ref Rng & Units 01/29/2023    4:47 PM 01/27/2023    6:24 PM 10/26/2021   10:02 AM  CMP  Glucose 70 - 99 mg/dL 161  096  045   BUN 6 - 20 mg/dL 9  9  7    Creatinine 0.57 - 1.00 mg/dL 4.09  8.11  9.14   Sodium 134 - 144 mmol/L 137  136  139   Potassium 3.5 - 5.2 mmol/L 4.7  4.0  4.6   Chloride 96 - 106 mmol/L 100  102  101   CO2 20 - 29 mmol/L 21  23  22     Calcium  8.7 - 10.2 mg/dL 9.6  9.3  9.4   Total Protein 6.0 - 8.5 g/dL 7.7   7.1   Total Bilirubin 0.0 - 1.2 mg/dL 0.3   0.3   Alkaline Phos 44 - 121 IU/L 101   93   AST 0 - 40 IU/L 13   16   ALT 0 - 32 IU/L 13   11    Lipid Panel     Component Value Date/Time   CHOL 224 (H) 01/29/2023 1647   TRIG 71 01/29/2023 1647   HDL 67 01/29/2023 1647   CHOLHDL 3.3 01/29/2023 1647   CHOLHDL 3 04/03/2016 0832   VLDL 9.0 04/03/2016 0832   LDLCALC 145 (H) 01/29/2023 1647    CBC    Component Value Date/Time   WBC 5.4 01/27/2023 1824   RBC 4.94 01/27/2023 1824   HGB 12.8 01/27/2023 1824   HGB 11.2 10/26/2021 1002   HCT 39.5 01/27/2023 1824   HCT 35.5 10/26/2021 1002   PLT 216 01/27/2023 1824   PLT 289 10/26/2021 1002   MCV 80.0 01/27/2023 1824   MCV 82 10/26/2021 1002   MCH 25.9 (L) 01/27/2023 1824   MCHC 32.4 01/27/2023 1824   RDW 16.9 (H) 01/27/2023 1824   RDW 14.9 10/26/2021 1002   LYMPHSABS 1.6 01/27/2023 1824   LYMPHSABS 2.4 10/26/2021 1002   MONOABS 0.7 01/27/2023 1824   EOSABS 0.2 01/27/2023 1824   EOSABS 0.2 10/26/2021 1002   BASOSABS 0.0 01/27/2023 1824   BASOSABS 0.0 10/26/2021 1002    ASSESSMENT AND PLAN: 1. Type 2 diabetes mellitus with obesity (HCC) (Primary) Not at goal ; A1c has increased.  Dietary habits contributing to poor control. Discussed dietary modifications; recommended eating smaller  fruits that have less sugar content like apples, strawberries and berries.  Pineapples should be eating only on occasions.  Went over the serving size of grapes to be eaten a few times a week.  - Increase glargine insulin  dosage from 25 units to 34 units daily.  Continue metformin  1 g twice a day.  Continue Trulicity  1.5 mg once a week. - Prescribe Dexcom 7 continuous glucose monitor, change sensor every 10 days. - Refer to a nutritionist for dietary counseling. - Order antibody testing to confirm type 2 diabetes diagnosis. - Schedule follow-up with clinical pharmacist in one  month. - POCT glycosylated hemoglobin (Hb A1C) - POCT glucose (manual entry) - Continuous Glucose Sensor (DEXCOM G7 SENSOR) MISC; Change every 10 days  Dispense: 3 each; Refill: 12 - Amb ref to Medical Nutrition Therapy-MNT - Insulin  Glargine (BASAGLAR  KWIKPEN) 100 UNIT/ML; Inject 34 Units into the skin at bedtime.  Dispense: 15 mL; Refill: 6 - IA-2 Autoantibodies - Glutamic acid decarboxylase auto abs  2. Pap smear for cervical cancer screening - Cervicovaginal ancillary only - Cytology - PAP    Patient was given the opportunity to ask questions.  Patient verbalized understanding of the plan and was able to repeat key elements of the plan.   This documentation was completed using Paediatric nurse.  Any transcriptional errors are unintentional.  Orders Placed This Encounter  Procedures   IA-2 Autoantibodies   Glutamic acid decarboxylase auto abs   Amb ref to Medical Nutrition Therapy-MNT   POCT glycosylated hemoglobin (Hb A1C)   POCT glucose (manual entry)     Requested Prescriptions   Signed Prescriptions Disp Refills   Continuous Glucose Sensor (DEXCOM G7 SENSOR) MISC 3 each 12    Sig: Change every 10 days   Insulin  Glargine (BASAGLAR  KWIKPEN) 100 UNIT/ML 15 mL 6    Sig: Inject 34 Units into the skin at bedtime.    Return in about 4 months (around 10/15/2023) for 4 weeks with clinical pharmacist for DM.  Concetta Dee, MD, FACP

## 2023-06-14 NOTE — Patient Instructions (Signed)
 VISIT SUMMARY:  Today, you came in for a Pap smear and to follow up on your diabetes management. We discussed your current medications, blood sugar levels, and dietary habits. Your A1c has increased, and we talked about ways to improve your diabetes control.  YOUR PLAN:  -TYPE 2 DIABETES MELLITUS WITH POOR CONTROL: Type 2 Diabetes Mellitus is a condition where your body does not use insulin  properly, leading to high blood sugar levels. Your A1c has increased to 13.9%, indicating poor control of your diabetes. We discussed increasing your insulin  dosage from 25 units to 34 units daily. You will also start using a Dexcom 7 continuous glucose monitor, which you should change every 10 days. Additionally, we will refer you to a nutritionist for dietary counseling and order antibody testing to confirm your type 2 diabetes diagnosis. Please schedule a follow-up with the clinical pharmacist in one month. Bring your reader ie your phone, with you for the Dexcom  INSTRUCTIONS:  Please follow up with the clinical pharmacist in one month. Make sure to start using the Dexcom 7 continuous glucose monitor and change the sensor every 10 days. Also, increase your insulin  dosage to 34 units daily and attend the dietary counseling session with the nutritionist. We will also conduct antibody testing to confirm your type 2 diabetes diagnosis.

## 2023-06-15 ENCOUNTER — Encounter: Payer: Self-pay | Admitting: Internal Medicine

## 2023-06-15 ENCOUNTER — Other Ambulatory Visit: Payer: Self-pay

## 2023-06-15 LAB — CERVICOVAGINAL ANCILLARY ONLY
Bacterial Vaginitis (gardnerella): NEGATIVE
Candida Glabrata: NEGATIVE
Candida Vaginitis: NEGATIVE
Chlamydia: NEGATIVE
Comment: NEGATIVE
Comment: NEGATIVE
Comment: NEGATIVE
Comment: NEGATIVE
Comment: NEGATIVE
Comment: NORMAL
Neisseria Gonorrhea: NEGATIVE
Trichomonas: NEGATIVE

## 2023-06-18 ENCOUNTER — Other Ambulatory Visit: Payer: Self-pay

## 2023-06-18 ENCOUNTER — Telehealth: Payer: Self-pay

## 2023-06-18 NOTE — Telephone Encounter (Signed)
 Pharmacy Patient Advocate Encounter   Received notification from CoverMyMeds that prior authorization for [facility] G7 SENSOR is required/requested.   Insurance verification completed.   The patient is insured through [facility] .   Per test claim: PA required; PA submitted to above mentioned insurance via CoverMyMeds Key/confirmation #/EOC [facility] Status is pending

## 2023-06-19 ENCOUNTER — Other Ambulatory Visit: Payer: Self-pay

## 2023-06-19 ENCOUNTER — Telehealth: Payer: Self-pay

## 2023-06-19 NOTE — Telephone Encounter (Signed)
 Pharmacy Patient Advocate Encounter  Received notification from [employer] that Prior Authorization for [facility] G7 SENSOR has been APPROVED from 04/17/2024 to 06/16/2024   PA #/Case ID/Reference #: [id number]

## 2023-06-20 ENCOUNTER — Encounter: Payer: Self-pay | Admitting: Internal Medicine

## 2023-06-20 LAB — CYTOLOGY - PAP: Diagnosis: NEGATIVE

## 2023-06-23 ENCOUNTER — Encounter: Payer: Self-pay | Admitting: Internal Medicine

## 2023-06-23 LAB — IA-2 AUTOANTIBODIES: IA-2 Autoantibodies: <7.5 U/mL

## 2023-06-23 LAB — GLUTAMIC ACID DECARBOXYLASE AUTO ABS: Glutamic Acid Decarb Ab: <5 U/mL (ref 0.0–5.0)

## 2023-06-25 ENCOUNTER — Other Ambulatory Visit: Payer: Self-pay

## 2023-06-26 ENCOUNTER — Other Ambulatory Visit: Payer: Self-pay

## 2023-06-27 ENCOUNTER — Other Ambulatory Visit: Payer: Self-pay

## 2023-07-13 ENCOUNTER — Ambulatory Visit: Admitting: Pharmacist

## 2023-10-17 ENCOUNTER — Telehealth: Payer: Self-pay | Admitting: Internal Medicine

## 2023-10-17 NOTE — Telephone Encounter (Signed)
 Voicemail left by volunteer to confirm patient's appointment for 10/18/2023

## 2023-10-18 ENCOUNTER — Ambulatory Visit: Admitting: Internal Medicine
